# Patient Record
Sex: Female | Born: 1945
Health system: Southern US, Community
[De-identification: ages and names within clinical notes are randomized; demographics above are authoritative.]

## PROBLEM LIST (undated history)

## (undated) DIAGNOSIS — Z9189 Other specified personal risk factors, not elsewhere classified: Secondary | ICD-10-CM

## (undated) DIAGNOSIS — F419 Anxiety disorder, unspecified: Secondary | ICD-10-CM

## (undated) DIAGNOSIS — R7303 Prediabetes: Secondary | ICD-10-CM

## (undated) DIAGNOSIS — C801 Malignant (primary) neoplasm, unspecified: Secondary | ICD-10-CM

## (undated) DIAGNOSIS — D649 Anemia, unspecified: Secondary | ICD-10-CM

## (undated) DIAGNOSIS — E039 Hypothyroidism, unspecified: Secondary | ICD-10-CM

## (undated) DIAGNOSIS — F32A Depression, unspecified: Secondary | ICD-10-CM

## (undated) DIAGNOSIS — G2581 Restless legs syndrome: Secondary | ICD-10-CM

## (undated) DIAGNOSIS — F329 Major depressive disorder, single episode, unspecified: Secondary | ICD-10-CM

## (undated) HISTORY — DX: Hypothyroidism, unspecified: E03.9

## (undated) HISTORY — DX: Other specified personal risk factors, not elsewhere classified: Z91.89

## (undated) HISTORY — DX: Prediabetes: R73.03

## (undated) HISTORY — DX: Anemia, unspecified: D64.9

## (undated) HISTORY — DX: Major depressive disorder, single episode, unspecified: F32.9

## (undated) HISTORY — DX: Anxiety disorder, unspecified: F41.9

## (undated) HISTORY — PX: THYROID CYST EXCISION: SHX2511

## (undated) HISTORY — PX: TONSILLECTOMY AND ADENOIDECTOMY: SUR1326

## (undated) HISTORY — PX: COLONOSCOPY: SHX174

## (undated) HISTORY — DX: Restless legs syndrome: G25.81

## (undated) HISTORY — DX: Depression, unspecified: F32.A

---

## 1998-01-12 ENCOUNTER — Encounter: Admission: RE | Admit: 1998-01-12 | Discharge: 1998-01-12 | Payer: Self-pay | Admitting: Family Medicine

## 1998-02-14 ENCOUNTER — Encounter: Admission: RE | Admit: 1998-02-14 | Discharge: 1998-02-14 | Payer: Self-pay | Admitting: Sports Medicine

## 1998-03-08 ENCOUNTER — Encounter: Admission: RE | Admit: 1998-03-08 | Discharge: 1998-03-08 | Payer: Self-pay | Admitting: Family Medicine

## 1998-05-02 ENCOUNTER — Encounter: Admission: RE | Admit: 1998-05-02 | Discharge: 1998-05-02 | Payer: Self-pay | Admitting: Sports Medicine

## 1998-05-03 ENCOUNTER — Encounter: Admission: RE | Admit: 1998-05-03 | Discharge: 1998-05-03 | Payer: Self-pay | Admitting: Family Medicine

## 1998-10-11 ENCOUNTER — Encounter: Admission: RE | Admit: 1998-10-11 | Discharge: 1998-10-11 | Payer: Self-pay | Admitting: Family Medicine

## 1999-07-10 ENCOUNTER — Encounter: Admission: RE | Admit: 1999-07-10 | Discharge: 1999-07-10 | Payer: Self-pay | Admitting: Family Medicine

## 1999-07-17 ENCOUNTER — Encounter: Admission: RE | Admit: 1999-07-17 | Discharge: 1999-07-17 | Payer: Self-pay | Admitting: Family Medicine

## 1999-08-28 ENCOUNTER — Encounter: Admission: RE | Admit: 1999-08-28 | Discharge: 1999-08-28 | Payer: Self-pay | Admitting: Family Medicine

## 2000-05-06 ENCOUNTER — Encounter: Admission: RE | Admit: 2000-05-06 | Discharge: 2000-05-06 | Payer: Self-pay | Admitting: Sports Medicine

## 2000-08-12 ENCOUNTER — Other Ambulatory Visit: Admission: RE | Admit: 2000-08-12 | Discharge: 2000-08-12 | Payer: Self-pay | Admitting: Family Medicine

## 2001-10-07 ENCOUNTER — Other Ambulatory Visit: Admission: RE | Admit: 2001-10-07 | Discharge: 2001-10-07 | Payer: Self-pay | Admitting: Family Medicine

## 2002-06-01 ENCOUNTER — Encounter: Admission: RE | Admit: 2002-06-01 | Discharge: 2002-06-01 | Payer: Self-pay | Admitting: Family Medicine

## 2002-06-01 ENCOUNTER — Encounter: Payer: Self-pay | Admitting: Family Medicine

## 2002-08-30 ENCOUNTER — Encounter: Admission: RE | Admit: 2002-08-30 | Discharge: 2002-08-30 | Payer: Self-pay

## 2002-10-11 ENCOUNTER — Other Ambulatory Visit: Admission: RE | Admit: 2002-10-11 | Discharge: 2002-10-11 | Payer: Self-pay | Admitting: Family Medicine

## 2005-04-16 ENCOUNTER — Other Ambulatory Visit: Admission: RE | Admit: 2005-04-16 | Discharge: 2005-04-16 | Payer: Self-pay | Admitting: Family Medicine

## 2005-09-16 ENCOUNTER — Ambulatory Visit (HOSPITAL_COMMUNITY): Admission: RE | Admit: 2005-09-16 | Discharge: 2005-09-16 | Payer: Self-pay | Admitting: Family Medicine

## 2008-03-04 ENCOUNTER — Other Ambulatory Visit: Admission: RE | Admit: 2008-03-04 | Discharge: 2008-03-04 | Payer: Self-pay | Admitting: Family Medicine

## 2009-04-19 ENCOUNTER — Other Ambulatory Visit: Admission: RE | Admit: 2009-04-19 | Discharge: 2009-04-19 | Payer: Self-pay | Admitting: Internal Medicine

## 2010-05-02 ENCOUNTER — Other Ambulatory Visit: Admission: RE | Admit: 2010-05-02 | Discharge: 2010-05-02 | Payer: Self-pay | Admitting: Internal Medicine

## 2010-10-25 ENCOUNTER — Ambulatory Visit (HOSPITAL_BASED_OUTPATIENT_CLINIC_OR_DEPARTMENT_OTHER): Admission: RE | Admit: 2010-10-25 | Payer: 59 | Source: Ambulatory Visit | Admitting: Orthopedic Surgery

## 2011-05-20 ENCOUNTER — Ambulatory Visit: Payer: 59 | Admitting: Internal Medicine

## 2011-06-25 DIAGNOSIS — E039 Hypothyroidism, unspecified: Secondary | ICD-10-CM | POA: Diagnosis not present

## 2011-06-25 DIAGNOSIS — Z79899 Other long term (current) drug therapy: Secondary | ICD-10-CM | POA: Diagnosis not present

## 2011-06-25 DIAGNOSIS — Z1212 Encounter for screening for malignant neoplasm of rectum: Secondary | ICD-10-CM | POA: Diagnosis not present

## 2011-06-25 DIAGNOSIS — I10 Essential (primary) hypertension: Secondary | ICD-10-CM | POA: Diagnosis not present

## 2011-06-25 DIAGNOSIS — E782 Mixed hyperlipidemia: Secondary | ICD-10-CM | POA: Diagnosis not present

## 2011-06-25 DIAGNOSIS — E559 Vitamin D deficiency, unspecified: Secondary | ICD-10-CM | POA: Diagnosis not present

## 2011-07-03 DIAGNOSIS — L570 Actinic keratosis: Secondary | ICD-10-CM | POA: Diagnosis not present

## 2011-07-03 DIAGNOSIS — L259 Unspecified contact dermatitis, unspecified cause: Secondary | ICD-10-CM | POA: Diagnosis not present

## 2011-07-24 DIAGNOSIS — L259 Unspecified contact dermatitis, unspecified cause: Secondary | ICD-10-CM | POA: Diagnosis not present

## 2012-02-11 DIAGNOSIS — N3000 Acute cystitis without hematuria: Secondary | ICD-10-CM | POA: Diagnosis not present

## 2012-02-11 DIAGNOSIS — E039 Hypothyroidism, unspecified: Secondary | ICD-10-CM | POA: Diagnosis not present

## 2012-02-11 DIAGNOSIS — Z79899 Other long term (current) drug therapy: Secondary | ICD-10-CM | POA: Diagnosis not present

## 2012-02-11 DIAGNOSIS — R209 Unspecified disturbances of skin sensation: Secondary | ICD-10-CM | POA: Diagnosis not present

## 2012-02-11 DIAGNOSIS — E559 Vitamin D deficiency, unspecified: Secondary | ICD-10-CM | POA: Diagnosis not present

## 2012-02-11 DIAGNOSIS — D649 Anemia, unspecified: Secondary | ICD-10-CM | POA: Diagnosis not present

## 2012-02-11 DIAGNOSIS — R7309 Other abnormal glucose: Secondary | ICD-10-CM | POA: Diagnosis not present

## 2012-02-11 DIAGNOSIS — D518 Other vitamin B12 deficiency anemias: Secondary | ICD-10-CM | POA: Diagnosis not present

## 2012-02-13 DIAGNOSIS — H9319 Tinnitus, unspecified ear: Secondary | ICD-10-CM | POA: Diagnosis not present

## 2012-02-13 DIAGNOSIS — H905 Unspecified sensorineural hearing loss: Secondary | ICD-10-CM | POA: Diagnosis not present

## 2012-02-13 DIAGNOSIS — H6120 Impacted cerumen, unspecified ear: Secondary | ICD-10-CM | POA: Diagnosis not present

## 2012-03-31 DIAGNOSIS — N3000 Acute cystitis without hematuria: Secondary | ICD-10-CM | POA: Diagnosis not present

## 2012-03-31 DIAGNOSIS — E559 Vitamin D deficiency, unspecified: Secondary | ICD-10-CM | POA: Diagnosis not present

## 2012-03-31 DIAGNOSIS — E039 Hypothyroidism, unspecified: Secondary | ICD-10-CM | POA: Diagnosis not present

## 2012-06-29 DIAGNOSIS — Z79899 Other long term (current) drug therapy: Secondary | ICD-10-CM | POA: Diagnosis not present

## 2012-06-29 DIAGNOSIS — I10 Essential (primary) hypertension: Secondary | ICD-10-CM | POA: Diagnosis not present

## 2012-06-29 DIAGNOSIS — E782 Mixed hyperlipidemia: Secondary | ICD-10-CM | POA: Diagnosis not present

## 2012-06-29 DIAGNOSIS — R7309 Other abnormal glucose: Secondary | ICD-10-CM | POA: Diagnosis not present

## 2012-06-29 DIAGNOSIS — E559 Vitamin D deficiency, unspecified: Secondary | ICD-10-CM | POA: Diagnosis not present

## 2012-07-03 DIAGNOSIS — L570 Actinic keratosis: Secondary | ICD-10-CM | POA: Diagnosis not present

## 2012-07-21 ENCOUNTER — Other Ambulatory Visit (HOSPITAL_COMMUNITY): Payer: Self-pay | Admitting: Internal Medicine

## 2012-07-21 DIAGNOSIS — Z780 Asymptomatic menopausal state: Secondary | ICD-10-CM

## 2012-07-21 DIAGNOSIS — Z1231 Encounter for screening mammogram for malignant neoplasm of breast: Secondary | ICD-10-CM

## 2012-07-31 ENCOUNTER — Ambulatory Visit (HOSPITAL_COMMUNITY): Payer: 59

## 2012-07-31 ENCOUNTER — Other Ambulatory Visit (HOSPITAL_COMMUNITY): Payer: 59

## 2012-08-04 ENCOUNTER — Ambulatory Visit (HOSPITAL_COMMUNITY): Payer: 59

## 2012-09-03 DIAGNOSIS — E209 Hypoparathyroidism, unspecified: Secondary | ICD-10-CM | POA: Diagnosis not present

## 2012-09-03 DIAGNOSIS — E559 Vitamin D deficiency, unspecified: Secondary | ICD-10-CM | POA: Diagnosis not present

## 2012-09-03 DIAGNOSIS — I10 Essential (primary) hypertension: Secondary | ICD-10-CM | POA: Diagnosis not present

## 2012-09-03 DIAGNOSIS — D649 Anemia, unspecified: Secondary | ICD-10-CM | POA: Diagnosis not present

## 2012-09-10 DIAGNOSIS — F411 Generalized anxiety disorder: Secondary | ICD-10-CM | POA: Diagnosis not present

## 2012-09-11 ENCOUNTER — Encounter: Payer: Self-pay | Admitting: Internal Medicine

## 2012-09-24 DIAGNOSIS — F331 Major depressive disorder, recurrent, moderate: Secondary | ICD-10-CM | POA: Diagnosis not present

## 2012-09-28 ENCOUNTER — Ambulatory Visit (INDEPENDENT_AMBULATORY_CARE_PROVIDER_SITE_OTHER): Payer: Medicare Other | Admitting: Internal Medicine

## 2012-09-28 ENCOUNTER — Other Ambulatory Visit: Payer: Medicare Other

## 2012-09-28 ENCOUNTER — Encounter: Payer: Self-pay | Admitting: Internal Medicine

## 2012-09-28 VITALS — BP 110/70 | HR 56 | Ht 69.0 in | Wt 119.0 lb

## 2012-09-28 DIAGNOSIS — R1310 Dysphagia, unspecified: Secondary | ICD-10-CM | POA: Diagnosis not present

## 2012-09-28 DIAGNOSIS — D509 Iron deficiency anemia, unspecified: Secondary | ICD-10-CM

## 2012-09-28 DIAGNOSIS — Z1211 Encounter for screening for malignant neoplasm of colon: Secondary | ICD-10-CM | POA: Diagnosis not present

## 2012-09-28 MED ORDER — MOVIPREP 100 G PO SOLR: 1.0000 | Freq: Once | ORAL | Status: DC

## 2012-09-28 NOTE — Patient Instructions (Addendum)
Your physician has requested that you go to the basement for the following lab work before leaving today:  TTG  You have been scheduled for an endoscopy and colonoscopy with propofol. Please follow the written instructions given to you at your visit today. Please pick up your prep at the pharmacy within the next 1-3 days. If you use inhalers (even only as needed), please bring them with you on the day of your procedure.

## 2012-09-28 NOTE — Progress Notes (Signed)
HISTORY OF PRESENT ILLNESS:  Debra Hayes is a 67 y.o. female with anxiety, depression and hypothyroidism who is sent today by her primary provider regarding possible iron deficiency and the need for colonoscopy. Review of outside laboratories from 09/03/2012 finds a normal hemoglobin 15.3. Also normal is the MCV at 89.1. Iron saturation is borderline low at 19%. However serum iron is normal at 70, reticulocytes normal at 1.3%, and ferritin normal at 121. Also normal is her sedimentation rate. The patient denies prior history GI evaluations. She has not had screening colonoscopy. Her GI review of systems is remarkable for a vague dysphagia, 8 pound weight loss over the past year, and belching. She does report a family history of celiac sprue. She denies melena or hematochezia. No NSAIDs. No recent blood donation  REVIEW OF SYSTEMS:  All non-GI ROS negative except for bitter taste in mouth, warm sensation over the body, anxiety, depression, irregular heartbeat, sleeping problems, excessive urination  Past Medical History  Diagnosis Date  . Anxiety   . Hypothyroidism   . Anemia   . Depression   . At high risk for altered glucose metabolism   . RLS (restless legs syndrome)     Past Surgical History  Procedure Laterality Date  . Thyroid cyst excision      Social History Debra Hayes  reports that she has quit smoking. She has never used smokeless tobacco. She reports that  drinks alcohol. She reports that she does not use illicit drugs.  family history includes Bladder Cancer in her mother; Heart disease in her father; and Thyroid cancer in her father.  No Known Allergies     PHYSICAL EXAMINATION: Vital signs: BP 110/70  Pulse 56  Ht 5\' 9"  (1.753 m)  Wt 119 lb (53.978 kg)  BMI 17.57 kg/m2  Constitutional: generally well-appearing, no acute distress Psychiatric: alert and oriented x3, cooperative Eyes: extraocular movements intact, anicteric, conjunctiva pink Mouth: oral  pharynx moist, no lesions Neck: supple no lymphadenopathy Cardiovascular: heart regular rate and rhythm, no murmur Lungs: clear to auscultation bilaterally Abdomen: soft, nontender, nondistended, no obvious ascites, no peritoneal signs, normal bowel sounds, no organomegaly Rectal: Deferred until colonoscopy Extremities: no lower extremity edema bilaterally Skin: no lesions on visible extremities Neuro: No focal deficits. No asterixis.    ASSESSMENT:  #1. Question early iron deficiency. #2. Vague dysphagia #3. Screening colonoscopy. Baseline risk. Appropriate candidate without contraindication   PLAN:  #1. Colonoscopy.The nature of the procedure, as well as the risks, benefits, and alternatives were carefully and thoroughly reviewed with the patient. Ample time for discussion and questions allowed. The patient understood, was satisfied, and agreed to proceed. #2. Movi prep prescribed. The patient instructed on its use #3. Upper endoscopy with duodenal biopsies.The nature of the procedure, as well as the risks, benefits, and alternatives were carefully and thoroughly reviewed with the patient. Ample time for discussion and questions allowed. The patient understood, was satisfied, and agreed to proceed. #4. Tissue transglutaminase antibody to screen for celiac disease

## 2012-09-29 LAB — TISSUE TRANSGLUTAMINASE, IGA: Tissue Transglutaminase Ab, IgA: 2.3 U/mL (ref <20)

## 2012-10-05 ENCOUNTER — Telehealth: Payer: Self-pay | Admitting: Internal Medicine

## 2012-10-05 NOTE — Telephone Encounter (Signed)
Pt states that when she had her OV she did not have her questions ready. Pt has several questions and would either like for Dr. Marina Goodell to call her or address her questions through this message.  1. Pt wants to know if there was an indicator in her labs that show a decrease in iron uptake or a lack there of and is this what shows   the need for the procedures.  2. If there is a flag in the bloodwork does it indicate that she may be bleeding from the inside or does she have malabsorption of iron.  3. Is it urgent for her to do the procedures now or can she safely wait a couple of months.  4. Would an "iron shot" help her situation.  5. Why does Dr. Marina Goodell propose using propofol instead of twilight sleep.  6. If she is having both procedures in one day does that propose an increased risk being under anesthesia the longer amount of time.  7. As she has aged she has noticed that it takes longer for her to heal and she bleeds more. Pt wants to know that if she has polyps and has them removed is that something to be concerned about....healing slower and the bleeding.  8. If a colon was done on her in the past (which it was not) and it was normal would he feel it is necessary to have the procedure done now.  9. Her recent labs have shown her glucose elevated and she is concerned about the prep and drinking juices and is concerned about her blood sugar changes and if that is something to be concerned about.

## 2012-10-07 NOTE — Telephone Encounter (Signed)
I had a long telephone conversation with the patient this morning. I answered each for 9 questions below in detail. She was satisfied with the answers and appreciative for my call. She will proceed with her endoscopic evaluations tomorrow as planned

## 2012-10-08 ENCOUNTER — Encounter: Payer: Self-pay | Admitting: Internal Medicine

## 2012-10-08 ENCOUNTER — Ambulatory Visit (AMBULATORY_SURGERY_CENTER): Payer: Medicare Other | Admitting: Internal Medicine

## 2012-10-08 VITALS — BP 105/70 | HR 60 | Temp 97.6°F | Resp 25 | Ht 69.0 in | Wt 119.0 lb

## 2012-10-08 DIAGNOSIS — K5730 Diverticulosis of large intestine without perforation or abscess without bleeding: Secondary | ICD-10-CM

## 2012-10-08 DIAGNOSIS — D1330 Benign neoplasm of unspecified part of small intestine: Secondary | ICD-10-CM

## 2012-10-08 DIAGNOSIS — D509 Iron deficiency anemia, unspecified: Secondary | ICD-10-CM | POA: Diagnosis not present

## 2012-10-08 DIAGNOSIS — Z1211 Encounter for screening for malignant neoplasm of colon: Secondary | ICD-10-CM | POA: Diagnosis not present

## 2012-10-08 MED ORDER — SODIUM CHLORIDE 0.9 % IV SOLN: 500.0000 mL | INTRAVENOUS | Status: DC

## 2012-10-08 NOTE — Op Note (Signed)
[facility] [address] [place], [address]   ENDOSCOPY PROCEDURE REPORT  PATIENT: Debra Hayes, Debra Hayes  MR#: [id number] BIRTHDATE: 25-Jun-1945 , 66  yrs. old GENDER: Female ENDOSCOPIST: Roxy Cedar, MD REFERRED BY:  Lucky Cowboy, M.D. PROCEDURE DATE:  10/08/2012 PROCEDURE:  EGD w/ biopsy ASA CLASS:     Class II INDICATIONS:  Iron deficiency anemia. MEDICATIONS: MAC sedation, administered by CRNA and propofol (Diprivan) 100mg  IV TOPICAL ANESTHETIC: none  DESCRIPTION OF PROCEDURE: After the risks benefits and alternatives of the procedure were thoroughly explained, informed consent was obtained.  The LB GIF-H180 [id number] endoscope was introduced through the mouth and advanced to the second portion of the duodenum. Without limitations.  The instrument was slowly withdrawn as the mucosa was fully examined.      The upper, middle and distal third of the esophagus were carefully inspected and no abnormalities were noted.  The z-line was well seen at the GEJ.  The endoscope was pushed into the fundus which was normal including a retroflexed view.  The antrum, gastric body, first and second part of the duodenum were unremarkable.Duodenal bx taken to r/o sprue.  Retroflexed views revealed no abnormalities. The scope was then withdrawn from the patient and the procedure completed.  COMPLICATIONS: There were no complications. ENDOSCOPIC IMPRESSION: 1. Normal EGD  RECOMMENDATIONS: 1. Await biopsy results 2. Multivitamin with iron  REPEAT EXAM:  eSigned:  Roxy Cedar, MD 10/08/2012 12:36 PM   XB:[id number] Oneta Rack, MD and The Patient

## 2012-10-08 NOTE — Progress Notes (Signed)
Called to room to assist during endoscopic procedure.  Patient ID and intended procedure confirmed with present staff. Received instructions for my participation in the procedure from the performing physician.  

## 2012-10-08 NOTE — Progress Notes (Signed)
Lidocaine-40mg IV prior to Propofol InductionPropofol given over incremental dosages 

## 2012-10-08 NOTE — Patient Instructions (Addendum)
Discharge instructions given with verbal understanding. Handouts on diverticulosis and a high fiber diet given. Resume previous medications. YOU HAD AN ENDOSCOPIC PROCEDURE TODAY AT THE [facility]: Refer to the procedure report that was given to you for any specific questions about what was found during the examination.  If the procedure report does not answer your questions, please call your gastroenterologist to clarify.  If you requested that your care partner not be given the details of your procedure findings, then the procedure report has been included in a sealed envelope for you to review at your convenience later.  YOU SHOULD EXPECT: Some feelings of bloating in the abdomen. Passage of more gas than usual.  Walking can help get rid of the air that was put into your GI tract during the procedure and reduce the bloating. If you had a lower endoscopy (such as a colonoscopy or flexible sigmoidoscopy) you may notice spotting of blood in your stool or on the toilet paper. If you underwent a bowel prep for your procedure, then you may not have a normal bowel movement for a few days.  DIET: Your first meal following the procedure should be a light meal and then it is ok to progress to your normal diet.  A half-sandwich or bowl of soup is an example of a good first meal.  Heavy or fried foods are harder to digest and may make you feel nauseous or bloated.  Likewise meals heavy in dairy and vegetables can cause extra gas to form and this can also increase the bloating.  Drink plenty of fluids but you should avoid alcoholic beverages for 24 hours.  ACTIVITY: Your care partner should take you home directly after the procedure.  You should plan to take it easy, moving slowly for the rest of the day.  You can resume normal activity the day after the procedure however you should NOT DRIVE or use heavy machinery for 24 hours (because of the sedation medicines used during the test).    SYMPTOMS TO  REPORT IMMEDIATELY: A gastroenterologist can be reached at any hour.  During normal business hours, 8:30 AM to 5:00 PM Monday through Friday, call [phone].  After hours and on weekends, please call the GI answering service at [phone] who will take a message and have the physician on call contact you.   Following lower endoscopy (colonoscopy or flexible sigmoidoscopy):  Excessive amounts of blood in the stool  Significant tenderness or worsening of abdominal pains  Swelling of the abdomen that is new, acute  Fever of 100F or higher  Following upper endoscopy (EGD)  Vomiting of blood or coffee ground material  New chest pain or pain under the shoulder blades  Painful or persistently difficult swallowing  New shortness of breath  Fever of 100F or higher  Black, tarry-looking stools  FOLLOW UP: If any biopsies were taken you will be contacted by phone or by letter within the next 1-3 weeks.  Call your gastroenterologist if you have not heard about the biopsies in 3 weeks.  Our staff will call the home number listed on your records the next business day following your procedure to check on you and address any questions or concerns that you may have at that time regarding the information given to you following your procedure. This is a courtesy call and so if there is no answer at the home number and we have not heard from you through the emergency physician on call, we will  assume that you have returned to your regular daily activities without incident.  SIGNATURES/CONFIDENTIALITY: You and/or your care partner have signed paperwork which will be entered into your electronic medical record.  These signatures attest to the fact that that the information above on your After Visit Summary has been reviewed and is understood.  Full responsibility of the confidentiality of this discharge information lies with you and/or your care-partner.

## 2012-10-08 NOTE — Op Note (Signed)
[facility] [address] [place], [address]   COLONOSCOPY PROCEDURE REPORT  PATIENT: [name], [name]  MR#: [id number] BIRTHDATE: October 01, 1945 , 66  yrs. old GENDER: Female ENDOSCOPIST: Roxy Cedar, MD REFERRED IO:[id number] Oneta Rack, M.D. PROCEDURE DATE:  10/08/2012 PROCEDURE:   Colonoscopy, screening ASA CLASS:   Class II INDICATIONS:average risk screening. MEDICATIONS: MAC sedation, administered by CRNA and propofol (Diprivan) 200mg  IV  DESCRIPTION OF PROCEDURE:   After the risks benefits and alternatives of the procedure were thoroughly explained, informed consent was obtained.  A digital rectal exam revealed no abnormalities of the rectum.   The LB CF-H180AL [id number]  endoscope was introduced through the anus and advanced to the cecum, which was identified by both the appendix and ileocecal valve. No adverse events experienced.   The quality of the prep was excellent, using MoviPrep  The instrument was then slowly withdrawn as the colon was fully examined.      COLON FINDINGS: Moderate diverticulosis was noted in the sigmoid colon.   The colon mucosa was otherwise normal.   The mucosa appeared normal in the terminal ileum.  Retroflexed views revealed internal hemorrhoids. The time to cecum=5 minutes 32 seconds. Withdrawal time=11 minutes 30 seconds.  The scope was withdrawn and the procedure completed. COMPLICATIONS: There were no complications.  ENDOSCOPIC IMPRESSION: 1.   Moderate diverticulosis was noted in the sigmoid colon 2.   The colon mucosa was otherwise normal 3.   Normal mucosa in the terminal ileum  RECOMMENDATIONS: 1.  Continue current colorectal screening recommendations for "routine risk" patients with a repeat colonoscopy in 10 years. 2.  Upper endoscopy today (see report)   eSigned:  Roxy Cedar, MD 10/08/2012 12:32 PM   cc: Lucky Cowboy, MD and The Patient   PATIENT NAME:  Debra Hayes, Debra Hayes MR#: [id number]

## 2012-10-08 NOTE — Progress Notes (Signed)
Patient did not experience any of the following events: a burn prior to discharge; a fall within the facility; wrong site/side/patient/procedure/implant event; or a hospital transfer or hospital admission upon discharge from the facility. (G8907) Patient did not have preoperative order for IV antibiotic SSI prophylaxis. (G8918)  

## 2012-10-09 ENCOUNTER — Telehealth: Payer: Self-pay

## 2012-10-09 NOTE — Telephone Encounter (Signed)
  Follow up Call-  Call back number 10/08/2012  Post procedure Call Back phone  # [phone]  Permission to leave phone message Yes     Patient questions:  Do you have a fever, pain , or abdominal swelling? no Pain Score  0 *  Have you tolerated food without any problems? yes  Have you been able to return to your normal activities? yes  Do you have any questions about your discharge instructions: Diet   no Medications  no Follow up visit  no  Do you have questions or concerns about your Care? no  Actions: * If pain score is 4 or above: No action needed, pain <4.

## 2012-10-14 ENCOUNTER — Encounter: Payer: Self-pay | Admitting: Internal Medicine

## 2012-10-20 DIAGNOSIS — Z419 Encounter for procedure for purposes other than remedying health state, unspecified: Secondary | ICD-10-CM | POA: Diagnosis not present

## 2012-10-20 DIAGNOSIS — L280 Lichen simplex chronicus: Secondary | ICD-10-CM | POA: Diagnosis not present

## 2012-11-03 ENCOUNTER — Encounter: Payer: 59 | Admitting: Internal Medicine

## 2012-11-20 ENCOUNTER — Encounter: Payer: Self-pay | Admitting: Cardiology

## 2012-11-20 ENCOUNTER — Ambulatory Visit (INDEPENDENT_AMBULATORY_CARE_PROVIDER_SITE_OTHER): Payer: Medicare Other | Admitting: Cardiology

## 2012-11-20 VITALS — BP 118/66 | HR 69 | Ht 69.0 in | Wt 129.0 lb

## 2012-11-20 DIAGNOSIS — R002 Palpitations: Secondary | ICD-10-CM | POA: Diagnosis not present

## 2012-11-20 NOTE — Progress Notes (Signed)
HPI The patient has no prior cardiac history. She does have a family history with her father having coronary disease in his 3s. She has not had any prior cardiac testing. She's had some occasional palpitations. However, she was anxious because of the family history. She does try to exercise routinely. Sometimes at the end of an exercise regimen she'll notice increased lightheadedness. She might have a sensation like her heart is working harder or beating harder at times.  The patient denies any new symptoms such as chest discomfort, neck or arm discomfort. There has been no new shortness of breath, PND or orthopnea. There have been no reported palpitations, presyncope or syncope.   No Known Allergies  Current Outpatient Prescriptions  Medication Sig Dispense Refill  . levothyroxine (SYNTHROID, LEVOTHROID) 50 MCG tablet Take 50 mcg by mouth daily before breakfast. Takes everyday except Sunday      . MOVIPREP 100 G SOLR Take 1 kit (100 g total) by mouth once.  1 kit  0  . ALPRAZolam (XANAX PO) Take by mouth as needed.      . sertraline (ZOLOFT) 50 MG tablet Take 50 mg by mouth daily.       No current facility-administered medications for this visit.    Past Medical History  Diagnosis Date  . Anxiety   . Hypothyroidism   . Anemia   . Depression   . At high risk for altered glucose metabolism   . RLS (restless legs syndrome)     Past Surgical History  Procedure Laterality Date  . Thyroid cyst excision    . Tonsillectomy and adenoidectomy      Family History  Problem Relation Age of Onset  . Heart disease Father 50    2 heart attacks  . Bladder Cancer Mother   . Thyroid cancer Father     History   Social History  . Marital Status: Single    Spouse Name: N/A    Number of Children: 0  . Years of Education: N/A   Occupational History  . Not on file.   Social History Main Topics  . Smoking status: Former Smoker    Types: Cigarettes  . Smokeless tobacco: Never Used    Comment: Smoked 40 years ago.  . Alcohol Use: Yes     Comment: very little  . Drug Use: No  . Sexually Active: Not on file   Other Topics Concern  . Not on file   Social History Narrative   Lives alone.  She has a cat.      ROS:  Or tinnitus, cough, arthritis in her fingers and numbness and tingling in her feet. Otherwise as stated in the HPI and negative for all other systems.  PHYSICAL EXAM BP 118/66  Pulse 69  Ht 5\' 9"  (1.753 m)  Wt 129 lb (58.514 kg)  BMI 19.04 kg/m2 GENERAL:  Well appearing HEENT:  Pupils equal round and reactive, fundi not visualized, oral mucosa unremarkable NECK:  No jugular venous distention, waveform within normal limits, carotid upstroke brisk and symmetric, no bruits, no thyromegaly LYMPHATICS:  No cervical, inguinal adenopathy LUNGS:  Clear to auscultation bilaterally BACK:  No CVA tenderness CHEST:  Unremarkable HEART:  PMI not displaced or sustained,S1 and S2 within normal limits, no S3, no S4, no clicks, no rubs, no murmurs ABD:  Flat, positive bowel sounds normal in frequency in pitch, no bruits, no rebound, no guarding, no midline pulsatile mass, no hepatomegaly, no splenomegaly EXT:  2 plus pulses throughout,  no edema, no cyanosis no clubbing SKIN:  No rashes no nodules NEURO:  Cranial nerves II through XII grossly intact, motor grossly intact throughout PSYCH:  Cognitively intact, oriented to person place and time  EKG:  Sinus rhythm, rate 69, axis within normal limits, intervals within normal limits, no acute ST-T wave changes.   ASSESSMENT AND PLAN  FAMILY HISTORY OF CAD:  I have a low suspicion for obstructive coronary artery disease. However, she does have risk factors with a positive family history. I will bring the patient back for a POET (Plain Old Exercise Test). This will allow me to screen for obstructive coronary disease, risk stratify and very importantly provide a prescription for exercise.  PALPITATIONS:  These are not  particularly symptomatic. At this point no change in therapy is indicated.

## 2012-11-20 NOTE — Patient Instructions (Addendum)
The current medical regimen is effective;  continue present plan and medications.  Your physician has requested that you have an exercise tolerance test. For further information please visit [email or web address]. Please also follow instruction sheet, as given.   

## 2012-12-01 ENCOUNTER — Other Ambulatory Visit: Payer: Self-pay | Admitting: Endocrinology

## 2012-12-01 DIAGNOSIS — C73 Malignant neoplasm of thyroid gland: Secondary | ICD-10-CM | POA: Diagnosis not present

## 2012-12-01 DIAGNOSIS — R7301 Impaired fasting glucose: Secondary | ICD-10-CM | POA: Diagnosis not present

## 2012-12-01 DIAGNOSIS — E890 Postprocedural hypothyroidism: Secondary | ICD-10-CM | POA: Diagnosis not present

## 2012-12-01 DIAGNOSIS — C323 Malignant neoplasm of laryngeal cartilage: Secondary | ICD-10-CM

## 2012-12-02 DIAGNOSIS — L280 Lichen simplex chronicus: Secondary | ICD-10-CM | POA: Diagnosis not present

## 2012-12-07 ENCOUNTER — Ambulatory Visit
Admission: RE | Admit: 2012-12-07 | Discharge: 2012-12-07 | Disposition: A | Discharge status: Home / Self Care | Payer: Medicare Other | Source: Ambulatory Visit | Attending: Endocrinology | Admitting: Endocrinology

## 2012-12-07 DIAGNOSIS — C73 Malignant neoplasm of thyroid gland: Secondary | ICD-10-CM | POA: Diagnosis not present

## 2012-12-07 DIAGNOSIS — C323 Malignant neoplasm of laryngeal cartilage: Secondary | ICD-10-CM

## 2012-12-08 ENCOUNTER — Other Ambulatory Visit: Payer: Self-pay | Admitting: Endocrinology

## 2012-12-08 DIAGNOSIS — E041 Nontoxic single thyroid nodule: Secondary | ICD-10-CM

## 2012-12-10 ENCOUNTER — Ambulatory Visit (INDEPENDENT_AMBULATORY_CARE_PROVIDER_SITE_OTHER): Payer: Medicare Other | Admitting: Physician Assistant

## 2012-12-10 DIAGNOSIS — R0789 Other chest pain: Secondary | ICD-10-CM

## 2012-12-10 DIAGNOSIS — R002 Palpitations: Secondary | ICD-10-CM

## 2012-12-10 DIAGNOSIS — Z8249 Family history of ischemic heart disease and other diseases of the circulatory system: Secondary | ICD-10-CM | POA: Diagnosis not present

## 2012-12-10 NOTE — Progress Notes (Signed)
Exercise Treadmill Test  Pre-Exercise Testing Evaluation Rhythm: normal sinus  Rate: 72     Test  Exercise Tolerance Test Ordering MD: Angelina Sheriff, MD  Interpreting MD: Tereso Newcomer, PA-C  Unique Test No: 1  Treadmill:  1  Indication for ETT: chest pain - rule out ischemia  Contraindication to ETT: No   Stress Modality: exercise - treadmill  Cardiac Imaging Performed: non   Protocol: standard Bruce - maximal  Max BP:  179/75  Max MPHR (bpm):  154 85% MPR (bpm):  131  MPHR obtained (bpm):  142 % MPHR obtained:  92  Reached 85% MPHR (min:sec):  5:45 Total Exercise Time (min-sec):  7:01  Workload in METS:  8.5 Borg Scale: 19  Reason ETT Terminated:  patient's desire to stop    ST Segment Analysis At Rest: non-specific ST segment slurring With Exercise: borderline ST changes  Other Information Arrhythmia:  No Angina during ETT:  absent (0) Quality of ETT:  indeterminate  ETT Interpretation:  borderline (indeterminate) with non-specific ST changes  Comments: Fair exercise tolerance. No chest pain. Normal BP response to exercise. There is borderline inferolat ST depression that persists into recovery.  Cannot rule out ischemia. Good HR recovery in 1st minute post exercise (HR dropped by > 12 at 2 mph on 2% grade).  Recommendations: Patient with significant FHx CAD. There are borderline ST changes.  Ischemia cannot be ruled out. Discussed possibility of proceeding with further testing (myoview or ETT-Echo) She would like to think about this. Will leave ECGs for Dr. Rollene Rotunda to review. Signed,  Tereso Newcomer, PA-C   12/10/2012 11:46 AM

## 2012-12-15 ENCOUNTER — Other Ambulatory Visit (HOSPITAL_COMMUNITY)
Admission: RE | Admit: 2012-12-15 | Discharge: 2012-12-15 | Disposition: A | Discharge status: Home / Self Care | Payer: Medicare Other | Source: Ambulatory Visit | Attending: Interventional Radiology | Admitting: Interventional Radiology

## 2012-12-15 ENCOUNTER — Ambulatory Visit
Admission: RE | Admit: 2012-12-15 | Discharge: 2012-12-15 | Disposition: A | Discharge status: Home / Self Care | Payer: Medicare Other | Source: Ambulatory Visit | Attending: Endocrinology | Admitting: Endocrinology

## 2012-12-15 DIAGNOSIS — E041 Nontoxic single thyroid nodule: Secondary | ICD-10-CM | POA: Diagnosis not present

## 2012-12-15 DIAGNOSIS — E049 Nontoxic goiter, unspecified: Secondary | ICD-10-CM | POA: Insufficient documentation

## 2012-12-22 ENCOUNTER — Other Ambulatory Visit: Payer: Self-pay | Admitting: Endocrinology

## 2012-12-22 DIAGNOSIS — C73 Malignant neoplasm of thyroid gland: Secondary | ICD-10-CM

## 2013-01-06 ENCOUNTER — Other Ambulatory Visit: Payer: Self-pay | Admitting: Endocrinology

## 2013-01-06 ENCOUNTER — Ambulatory Visit
Admission: RE | Admit: 2013-01-06 | Discharge: 2013-01-06 | Disposition: A | Discharge status: Home / Self Care | Payer: Medicare Other | Source: Ambulatory Visit | Attending: Endocrinology | Admitting: Endocrinology

## 2013-01-06 DIAGNOSIS — E042 Nontoxic multinodular goiter: Secondary | ICD-10-CM | POA: Diagnosis not present

## 2013-01-06 DIAGNOSIS — E890 Postprocedural hypothyroidism: Secondary | ICD-10-CM | POA: Diagnosis not present

## 2013-02-03 DIAGNOSIS — B354 Tinea corporis: Secondary | ICD-10-CM | POA: Diagnosis not present

## 2013-05-24 ENCOUNTER — Ambulatory Visit
Admission: RE | Admit: 2013-05-24 | Discharge: 2013-05-24 | Disposition: A | Discharge status: Home / Self Care | Payer: Medicare Other | Source: Ambulatory Visit | Attending: Endocrinology | Admitting: Endocrinology

## 2013-05-24 DIAGNOSIS — C73 Malignant neoplasm of thyroid gland: Secondary | ICD-10-CM | POA: Diagnosis not present

## 2013-05-27 DIAGNOSIS — E890 Postprocedural hypothyroidism: Secondary | ICD-10-CM | POA: Diagnosis not present

## 2013-05-27 DIAGNOSIS — R7301 Impaired fasting glucose: Secondary | ICD-10-CM | POA: Diagnosis not present

## 2013-06-01 DIAGNOSIS — C73 Malignant neoplasm of thyroid gland: Secondary | ICD-10-CM | POA: Diagnosis not present

## 2013-06-01 DIAGNOSIS — R7301 Impaired fasting glucose: Secondary | ICD-10-CM | POA: Diagnosis not present

## 2013-06-01 DIAGNOSIS — E890 Postprocedural hypothyroidism: Secondary | ICD-10-CM | POA: Diagnosis not present

## 2013-07-06 ENCOUNTER — Encounter: Payer: Self-pay | Admitting: Physician Assistant

## 2013-08-17 ENCOUNTER — Encounter: Payer: Self-pay | Admitting: Physician Assistant

## 2013-08-19 ENCOUNTER — Encounter: Payer: Self-pay | Admitting: Physician Assistant

## 2013-11-11 ENCOUNTER — Encounter: Payer: Self-pay | Admitting: Physician Assistant

## 2013-11-16 ENCOUNTER — Encounter: Payer: Self-pay | Admitting: Physician Assistant

## 2013-11-18 ENCOUNTER — Encounter: Payer: Self-pay | Admitting: Physician Assistant

## 2013-11-24 ENCOUNTER — Other Ambulatory Visit: Payer: Self-pay | Admitting: Endocrinology

## 2013-11-24 DIAGNOSIS — C73 Malignant neoplasm of thyroid gland: Secondary | ICD-10-CM

## 2014-04-28 DIAGNOSIS — E039 Hypothyroidism, unspecified: Secondary | ICD-10-CM | POA: Diagnosis not present

## 2014-05-09 DIAGNOSIS — E039 Hypothyroidism, unspecified: Secondary | ICD-10-CM | POA: Insufficient documentation

## 2014-05-09 DIAGNOSIS — F325 Major depressive disorder, single episode, in full remission: Secondary | ICD-10-CM | POA: Insufficient documentation

## 2014-05-09 DIAGNOSIS — F419 Anxiety disorder, unspecified: Secondary | ICD-10-CM | POA: Insufficient documentation

## 2014-05-10 ENCOUNTER — Telehealth: Payer: Self-pay | Admitting: Internal Medicine

## 2014-05-10 ENCOUNTER — Ambulatory Visit: Payer: Self-pay | Admitting: Physician Assistant

## 2014-05-10 NOTE — Telephone Encounter (Signed)
Patient scheduled an appointment to address depression.  Patient will  resch after a consult with another provider.   Thank you, Katrina Judeth Horn [facility] [phone] Fax [phone]

## 2014-05-17 ENCOUNTER — Encounter: Payer: Self-pay | Admitting: Physician Assistant

## 2014-05-17 ENCOUNTER — Ambulatory Visit (INDEPENDENT_AMBULATORY_CARE_PROVIDER_SITE_OTHER): Payer: Medicare Other | Admitting: Physician Assistant

## 2014-05-17 VITALS — BP 130/80 | HR 76 | Temp 98.1°F | Resp 16 | Ht 68.0 in | Wt 124.0 lb

## 2014-05-17 DIAGNOSIS — R7309 Other abnormal glucose: Secondary | ICD-10-CM | POA: Diagnosis not present

## 2014-05-17 DIAGNOSIS — Z79899 Other long term (current) drug therapy: Secondary | ICD-10-CM | POA: Diagnosis not present

## 2014-05-17 DIAGNOSIS — F329 Major depressive disorder, single episode, unspecified: Secondary | ICD-10-CM | POA: Diagnosis not present

## 2014-05-17 DIAGNOSIS — E559 Vitamin D deficiency, unspecified: Secondary | ICD-10-CM | POA: Diagnosis not present

## 2014-05-17 DIAGNOSIS — E039 Hypothyroidism, unspecified: Secondary | ICD-10-CM | POA: Diagnosis not present

## 2014-05-17 DIAGNOSIS — F32A Depression, unspecified: Secondary | ICD-10-CM

## 2014-05-17 DIAGNOSIS — D649 Anemia, unspecified: Secondary | ICD-10-CM | POA: Diagnosis not present

## 2014-05-17 DIAGNOSIS — E538 Deficiency of other specified B group vitamins: Secondary | ICD-10-CM | POA: Diagnosis not present

## 2014-05-17 DIAGNOSIS — R7303 Prediabetes: Secondary | ICD-10-CM

## 2014-05-17 DIAGNOSIS — R002 Palpitations: Secondary | ICD-10-CM

## 2014-05-17 DIAGNOSIS — Z0001 Encounter for general adult medical examination with abnormal findings: Secondary | ICD-10-CM | POA: Diagnosis not present

## 2014-05-17 DIAGNOSIS — Z789 Other specified health status: Secondary | ICD-10-CM

## 2014-05-17 DIAGNOSIS — R6889 Other general symptoms and signs: Secondary | ICD-10-CM | POA: Diagnosis not present

## 2014-05-17 DIAGNOSIS — F419 Anxiety disorder, unspecified: Secondary | ICD-10-CM

## 2014-05-17 DIAGNOSIS — G4700 Insomnia, unspecified: Secondary | ICD-10-CM

## 2014-05-17 MED ORDER — SERTRALINE HCL 50 MG PO TABS: 50.0000 mg | ORAL_TABLET | Freq: Every day | ORAL | Status: DC

## 2014-05-17 NOTE — Progress Notes (Signed)
MEDICARE ANNUAL WELLNESS VISIT AND FOLLOW UP  Assessment:   1. Hypothyroidism, unspecified hypothyroidism type Continue follow up Dr. Chalmers Cater  2. Palpitation ? Anxiety versus GERD- will do one pill daily and does not want to treat the GERD at this time, will do diet  3. Anemia, unspecified anemia type Check labs, normal colonoscopy  4. Anxiety Zoloft added, continue counseling, if she does not tolerate discussed wellbutrin and lexapro as possible options  5. Depression Depression/Anxiety- continue medications, stress management techniques discussed, increase water, good sleep hygiene discussed, increase exercise, and increase veggies.   6. Insomnia-  -? Anxiety, ? RLS- long discussion will try zoloft first., discussed amitriptyline as a possibility, wants to avoid benzos.   Schedule MGM 1 month follow up   Plan:   During the course of the visit the patient was educated and counseled about appropriate screening and preventive services including:    Pneumococcal vaccine   Influenza vaccine  Td vaccine  Screening electrocardiogram  Screening mammography  Bone densitometry screening  Colorectal cancer screening  Diabetes screening  Glaucoma screening  Nutrition counseling   Advanced directives: given info/requested  Screening recommendations, referrals:  Vaccinations: Please see documentation below and orders this visit.   Nutrition assessed and recommended  Colonoscopy up to date Mammogram requested Pap smear not indicated Pelvic exam not indicated Recommended yearly ophthalmology/optometry visit for glaucoma screening and checkup Recommended yearly dental visit for hygiene and checkup Advanced directives - requested  Conditions/risks identified: BMI: Discussed weight loss, diet, and increase physical activity.  Increase physical activity: AHA recommends 150 minutes of physical activity a week.  Medications reviewed DEXA- requested Diabetes is at  goal, ACE/ARB therapy: No, Reason not on Ace Inhibitor/ARB therapy:  only predm Urinary Incontinence is not an issue: discussed non pharmacology and pharmacology options.  Fall risk: low- discussed PT, home fall assessment, medications.    Subjective:   Debra Hayes is a 68 y.o. female who presents for Medicare Annual Wellness Visit and 3 month follow up on hypertension, prediabetes, hyperlipidemia, vitamin D def.  Date of last medicare wellness visit is unknown.   Her blood pressure has been controlled at home, today their BP is BP: 130/80 mmHg She does workout. She denies chest pain, shortness of breath, dizziness.  She is not on cholesterol medication and denies myalgias. Her cholesterol is at goal. The cholesterol last visit was: LDL 93  She has been working on diet and exercise for prediabetes, and denies paresthesia of the feet, polydipsia and polyuria. Last A1C in the office was: 6.0 in 06/2012 Patient is on Vitamin D supplement, was 34 last visit. She is on thyroid medication, she has been seeing Dr. Chalmers Cater for her TSH. Her medication was not changed last visit was 3.946.  She was put on zoloft for anxiety/depression but is not longer on that, she states that this winter that her depression is worse. She sees a therapist, Dr. Kevan Ny and she suggests getting on a medication. Brother passed recently this summer from [place]. She states that she has has decrease interest in doing things, decrease eating, decreased sleeping. She also states she feels very restless, feels better walking, doing things.  Denies SI/HI.  She will have chest tightness/palpitations with anxiety, but denies any with exertion.  Has occ GERD symptoms.   Names of Other Physician/Practitioners you currently use: 1. [facility]- here for primary care 2. Mahalia Longest, Ph-D, # [phone], email [email or web address] 3. Dr. Trenton Gammon, dentist 4. Dr. Chalmers Cater  5. Dr. Sabra Heck, has appt  Dec Patient Care Team: Unk Pinto, MD as PCP - General (Internal Medicine) Rozetta Nunnery, MD as Consulting Physician (Otolaryngology) Melissa Noon, OD as Referring Physician (Optometry) Rolm Bookbinder, MD as Consulting Physician (Dermatology) Irene Shipper, MD as Consulting Physician (Gastroenterology)  Medication Review Current Outpatient Prescriptions on File Prior to Visit  Medication Sig Dispense Refill  . levothyroxine (SYNTHROID, LEVOTHROID) 50 MCG tablet Take 50 mcg by mouth daily before breakfast. Takes everyday except Sunday     No current facility-administered medications on file prior to visit.    Current Problems (verified) Patient Active Problem List   Diagnosis Date Noted  . Anemia   . Anxiety   . Depression   . Hypothyroidism   . Palpitation 11/20/2012    Screening Tests Health Maintenance  Topic Date Due  . ZOSTAVAX  05/14/2006  . MAMMOGRAM  09/17/2007  . PNEUMOCOCCAL POLYSACCHARIDE VACCINE AGE 68 AND OVER  05/15/2011  . INFLUENZA VACCINE  01/15/2014  . TETANUS/TDAP  06/17/2016  . COLONOSCOPY  10/09/2022     Immunization History  Administered Date(s) Administered  . Tdap 06/17/2006   Preventative care: Last colonoscopy: 2014 EGD 2014 normal Last mammogram: 2007- needs Last pap smear/pelvic exam: 2011 normal LMP age 25  DEXA:2008 MRI head normal 2004 Thyroid US 2014 unchanged  Prior vaccinations: TD or Tdap: 2008  Influenza: 2008 Pneumococcal: declines Prevnar13:  declines Shingles/Zostavax: declines  History reviewed: allergies, current medications, past family history, past medical history, past social history, past surgical history and problem list    Medication List       levothyroxine 50 MCG tablet  Commonly known as:  SYNTHROID, LEVOTHROID  Take 50 mcg by mouth daily before breakfast. Takes everyday except Sunday        Past Surgical History  Procedure Laterality Date  . Thyroid cyst excision    . Tonsillectomy and  adenoidectomy     Family History  Problem Relation Age of Onset  . Heart disease Father 50    2 heart attacks  . Bladder Cancer Mother   . Thyroid cancer Father     Risk Factors: Osteoporosis/FallRisk: postmenopausal estrogen deficiency and dietary calcium and/or vitamin D deficiency In the past year have you fallen or had a near fall?:No History of fracture in the past year: no  Tobacco History  Substance Use Topics  . Smoking status: Former Smoker    Types: Cigarettes  . Smokeless tobacco: Never Used     Comment: Smoked 40 years ago.  . Alcohol Use: Yes     Comment: very little   She does not smoke.  Patient is a former smoker. Are there smokers in your home (other than you)?  No  Alcohol Current alcohol use: social drinker  Caffeine Current caffeine use: coffee 1 /day  Exercise Current exercise: walking and yoga,zumba  Nutrition/Diet Current diet: in general, a "healthy" diet    Cardiac risk factors: advanced age (older than 52 for men, 59 for women).  Depression Screen (Note: if answer to either of the following is "Yes", a more complete depression screening is indicated)   Q1: Over the past two weeks, have you felt down, depressed or hopeless? Yes  Q2: Over the past two weeks, have you felt little interest or pleasure in doing things? Yes  Have you lost interest or pleasure in daily life? No  Do you often feel hopeless? No  Do you cry easily over simple problems? Yes  Activities of Daily  Living In your present state of health, do you have any difficulty performing the following activities?:  Driving? No Managing money?  No Feeding yourself? No Getting from bed to chair? No Climbing a flight of stairs? No Preparing food and eating?: No Bathing or showering? No Getting dressed: No Getting to the toilet? No Using the toilet:No Moving around from place to place: No   Are you sexually active?  No  Do you have more than one partner?  No  Vision  Difficulties: No  Hearing Difficulties: No Do you often ask people to speak up or repeat themselves? No Do you experience ringing or noises in your ears? No Do you have difficulty understanding soft or whispered voices? No  Cognition  Do you feel that you have a problem with memory?No  Do you often misplace items? No  Do you feel safe at home?  Yes  Advanced directives Does patient have a [place]? No Does patient have a Living Will? No   Objective:   Blood pressure 130/80, pulse 76, temperature 98.1 F (36.7 C), resp. rate 16, height 5\' 8"  (1.727 m), weight 124 lb (56.246 kg). Body mass index is 18.86 kg/(m^2).  General appearance: alert, no distress, WD/WN,  female Cognitive Testing  Alert? Yes  Normal Appearance?Yes  Oriented to person? Yes  Place? Alfred Levins   Time? Yes  Recall of three objects?  Yes  Can perform simple calculations? Yes  Displays appropriate judgment?Yes  Can read the correct time from a watch face?Yes  HEENT: normocephalic, sclerae anicteric, TMs pearly, nares patent, no discharge or erythema, pharynx normal Oral cavity: MMM, no lesions Neck: supple, no lymphadenopathy, no thyromegaly, no masses Heart: RRR, normal S1, S2, no murmurs Lungs: CTA bilaterally, no wheezes, rhonchi, or rales Abdomen: +bs, soft, non tender, non distended, no masses, no hepatomegaly, no splenomegaly Musculoskeletal: nontender, no swelling, no obvious deformity Extremities: no edema, no cyanosis, no clubbing Pulses: 2+ symmetric, upper and lower extremities, normal cap refill Neurological: alert, oriented x 3, CN2-12 intact, strength normal upper extremities and lower extremities, sensation normal throughout, DTRs 2+ throughout, no cerebellar signs, gait normal Psychiatric: normal affect, behavior normal, pleasant  Breast: defer Gyn: defer Rectal: defer  Medicare Attestation I have personally reviewed: The patient's medical and social history Their use of  alcohol, tobacco or illicit drugs Their current medications and supplements The patient's functional ability including ADLs,fall risks, home safety risks, cognitive, and hearing and visual impairment Diet and physical activities Evidence for depression or mood disorders  The patient's weight, height, BMI, and visual acuity have been recorded in the chart.  I have made referrals, counseling, and provided education to the patient based on review of the above and I have provided the patient with a written personalized care plan for preventive services.     Vicie Mutters, PA-C   05/17/2014

## 2014-05-17 NOTE — Patient Instructions (Addendum)
Please start 1/2 of the zoloft for 1 week at supper time, after 1 week (5-7 days) increase to 1 pill at night.   Please call if you have any quesitons or concerns, if this does not help we will try a different medication.   Can try melatonin 5mg -15 mg at night for sleep, can also do benadryl 25-50mg  at night for sleep.  If this does not help we can try prescription medication.  Also here is some information about good sleep hygiene.   Insomnia Insomnia is frequent trouble falling and/or staying asleep. Insomnia can be a long term problem or a short term problem. Both are common. Insomnia can be a short term problem when the wakefulness is related to a certain stress or worry. Long term insomnia is often related to ongoing stress during waking hours and/or poor sleeping habits. Overtime, sleep deprivation itself can make the problem worse. Every little thing feels more severe because you are overtired and your ability to cope is decreased. CAUSES   Stress, anxiety, and depression.  Poor sleeping habits.  Distractions such as TV in the bedroom.  Naps close to bedtime.  Engaging in emotionally charged conversations before bed.  Technical reading before sleep.  Alcohol and other sedatives. They may make the problem worse. They can hurt normal sleep patterns and normal dream activity.  Stimulants such as caffeine for several hours prior to bedtime.  Pain syndromes and shortness of breath can cause insomnia.  Exercise late at night.  Changing time zones may cause sleeping problems (jet lag). It is sometimes helpful to have someone observe your sleeping patterns. They should look for periods of not breathing during the night (sleep apnea). They should also look to see how long those periods last. If you live alone or observers are uncertain, you can also be observed at a sleep clinic where your sleep patterns will be professionally monitored. Sleep apnea requires a checkup and treatment.  Give your caregivers your medical history. Give your caregivers observations your family has made about your sleep.  SYMPTOMS   Not feeling rested in the morning.  Anxiety and restlessness at bedtime.  Difficulty falling and staying asleep. TREATMENT   Your caregiver may prescribe treatment for an underlying medical disorders. Your caregiver can give advice or help if you are using alcohol or other drugs for self-medication. Treatment of underlying problems will usually eliminate insomnia problems.  Medications can be prescribed for short time use. They are generally not recommended for lengthy use.  Over-the-counter sleep medicines are not recommended for lengthy use. They can be habit forming.  You can promote easier sleeping by making lifestyle changes such as:  Using relaxation techniques that help with breathing and reduce muscle tension.  Exercising earlier in the day.  Changing your diet and the time of your last meal. No night time snacks.  Establish a regular time to go to bed.  Counseling can help with stressful problems and worry.  Soothing music and white noise may be helpful if there are background noises you cannot remove.  Stop tedious detailed work at least one hour before bedtime. HOME CARE INSTRUCTIONS   Keep a diary. Inform your caregiver about your progress. This includes any medication side effects. See your caregiver regularly. Take note of:  Times when you are asleep.  Times when you are awake during the night.  The quality of your sleep.  How you feel the next day. This information will help your caregiver care for you.  Get out of bed if you are still awake after 15 minutes. Read or do some quiet activity. Keep the lights down. Wait until you feel sleepy and go back to bed.  Keep regular sleeping and waking hours. Avoid naps.  Exercise regularly.  Avoid distractions at bedtime. Distractions include watching television or engaging in any  intense or detailed activity like attempting to balance the household checkbook.  Develop a bedtime ritual. Keep a familiar routine of bathing, brushing your teeth, climbing into bed at the same time each night, listening to soothing music. Routines increase the success of falling to sleep faster.  Use relaxation techniques. This can be using breathing and muscle tension release routines. It can also include visualizing peaceful scenes. You can also help control troubling or intruding thoughts by keeping your mind occupied with boring or repetitive thoughts like the old concept of counting sheep. You can make it more creative like imagining planting one beautiful flower after another in your backyard garden.  During your day, work to eliminate stress. When this is not possible use some of the previous suggestions to help reduce the anxiety that accompanies stressful situations. MAKE SURE YOU:   Understand these instructions.  Will watch your condition.  Will get help right away if you are not doing well or get worse. Document Released: 05/31/2000 Document Revised: 08/26/2011 Document Reviewed: 07/01/2007 [facility] Patient Information 2015 [place], [place]. This information is not intended to replace advice given to you by your health care provider. Make sure you discuss any questions you have with your health care provider.  Restless Legs Syndrome Restless legs syndrome is a movement disorder. It may also be called a sensorimotor disorder.  CAUSES  No one knows what specifically causes restless legs syndrome, but it tends to run in families. It is also more common in people with low iron, in pregnancy, in people who need dialysis, and those with nerve damage (neuropathy).Some medications may make restless legs syndrome worse.Those medications include drugs to treat high blood pressure, some heart conditions, nausea, colds, allergies, and depression. SYMPTOMS Symptoms include uncomfortable  sensations in the legs. These leg sensations are worse during periods of inactivity or rest. They are also worse while sitting or lying down. Individuals that have the disorder describe sensations in the legs that feel like:  Pulling.  Drawing.  Crawling.  Worming.  Boring.  Tingling.  Pins and needles.  Prickling.  Pain. The sensations are usually accompanied by an overwhelming urge to move the legs. Sudden muscle jerks may also occur. Movement provides temporary relief from the discomfort. In rare cases, the arms may also be affected. Symptoms may interfere with going to sleep (sleep onset insomnia). Restless legs syndrome may also be related to periodic limb movement disorder (PLMD). PLMD is another more common motor disorder. It also causes interrupted sleep. The symptoms from PLMD usually occur most often when you are awake. TREATMENT  Treatment for restless legs syndrome is symptomatic. This means that the symptoms are treated.   Massage and cold compresses may provide temporary relief.  Walk, stretch, or take a cold or hot bath.  Get regular exercise and a good night's sleep.  Avoid caffeine, alcohol, nicotine, and medications that can make it worse.  Do activities that provide mental stimulation like discussions, needlework, and video games. These may be helpful if you are not able to walk or stretch. Some medications are effective in relieving the symptoms. However, many of these medications have side effects. Ask your caregiver  about medications that may help your symptoms. Correcting iron deficiency may improve symptoms for some patients. Document Released: 05/24/2002 Document Revised: 10/18/2013 Document Reviewed: 08/30/2010 [facility] Patient Information 2015 [place], [place]. This information is not intended to replace advice given to you by your health care provider. Make sure you discuss any questions you have with your health care provider.

## 2014-05-18 LAB — CBC WITH DIFFERENTIAL/PLATELET
BASOS ABS: 0 10*3/uL (ref 0.0–0.1)
Basophils Relative: 0 % (ref 0–1)
EOS ABS: 0.1 10*3/uL (ref 0.0–0.7)
EOS PCT: 1 % (ref 0–5)
HCT: 44.6 % (ref 36.0–46.0)
Hemoglobin: 15 g/dL (ref 12.0–15.0)
LYMPHS PCT: 25 % (ref 12–46)
Lymphs Abs: 2.3 10*3/uL (ref 0.7–4.0)
MCH: 30.4 pg (ref 26.0–34.0)
MCHC: 33.6 g/dL (ref 30.0–36.0)
MCV: 90.3 fL (ref 78.0–100.0)
MPV: 10.2 fL (ref 9.4–12.4)
Monocytes Absolute: 0.6 10*3/uL (ref 0.1–1.0)
Monocytes Relative: 7 % (ref 3–12)
NEUTROS PCT: 67 % (ref 43–77)
Neutro Abs: 6.2 10*3/uL (ref 1.7–7.7)
PLATELETS: 326 10*3/uL (ref 150–400)
RBC: 4.94 MIL/uL (ref 3.87–5.11)
RDW: 13.6 % (ref 11.5–15.5)
WBC: 9.2 10*3/uL (ref 4.0–10.5)

## 2014-05-18 LAB — BASIC METABOLIC PANEL WITH GFR
BUN: 20 mg/dL (ref 6–23)
CALCIUM: 9.4 mg/dL (ref 8.4–10.5)
CO2: 25 mEq/L (ref 19–32)
CREATININE: 0.82 mg/dL (ref 0.50–1.10)
Chloride: 105 mEq/L (ref 96–112)
GFR, EST NON AFRICAN AMERICAN: 74 mL/min
GFR, Est African American: 85 mL/min
Glucose, Bld: 103 mg/dL — ABNORMAL HIGH (ref 70–99)
Potassium: 4.2 mEq/L (ref 3.5–5.3)
SODIUM: 137 meq/L (ref 135–145)

## 2014-05-18 LAB — HEPATIC FUNCTION PANEL
ALT: 17 U/L (ref 0–35)
AST: 21 U/L (ref 0–37)
Albumin: 4.5 g/dL (ref 3.5–5.2)
Alkaline Phosphatase: 66 U/L (ref 39–117)
Bilirubin, Direct: 0.1 mg/dL (ref 0.0–0.3)
Indirect Bilirubin: 0.4 mg/dL (ref 0.2–1.2)
Total Bilirubin: 0.5 mg/dL (ref 0.2–1.2)
Total Protein: 6.8 g/dL (ref 6.0–8.3)

## 2014-05-18 LAB — LIPID PANEL
Cholesterol: 201 mg/dL — ABNORMAL HIGH (ref 0–200)
HDL: 70 mg/dL
LDL Cholesterol: 101 mg/dL — ABNORMAL HIGH (ref 0–99)
Total CHOL/HDL Ratio: 2.9 ratio
Triglycerides: 148 mg/dL
VLDL: 30 mg/dL (ref 0–40)

## 2014-05-18 LAB — INSULIN, FASTING: Insulin fasting, serum: 3.8 u[IU]/mL (ref 2.0–19.6)

## 2014-05-18 LAB — IRON AND TIBC
%SAT: 21 % (ref 20–55)
IRON: 74 ug/dL (ref 42–145)
TIBC: 345 ug/dL (ref 250–470)
UIBC: 271 ug/dL (ref 125–400)

## 2014-05-18 LAB — HEMOGLOBIN A1C
Hgb A1c MFr Bld: 6.3 % — ABNORMAL HIGH
Mean Plasma Glucose: 134 mg/dL — ABNORMAL HIGH

## 2014-05-18 LAB — VITAMIN D 25 HYDROXY (VIT D DEFICIENCY, FRACTURES): Vit D, 25-Hydroxy: 38 ng/mL (ref 30–100)

## 2014-05-18 LAB — VITAMIN B12: Vitamin B-12: 839 pg/mL (ref 211–911)

## 2014-05-18 LAB — MAGNESIUM: MAGNESIUM: 2.2 mg/dL (ref 1.5–2.5)

## 2014-05-18 LAB — FERRITIN: Ferritin: 90 ng/mL (ref 10–291)

## 2014-05-23 ENCOUNTER — Ambulatory Visit
Admission: RE | Admit: 2014-05-23 | Discharge: 2014-05-23 | Disposition: A | Discharge status: Home / Self Care | Payer: Medicare Other | Source: Ambulatory Visit | Attending: Endocrinology | Admitting: Endocrinology

## 2014-05-23 DIAGNOSIS — E041 Nontoxic single thyroid nodule: Secondary | ICD-10-CM | POA: Diagnosis not present

## 2014-05-23 DIAGNOSIS — C73 Malignant neoplasm of thyroid gland: Secondary | ICD-10-CM

## 2014-05-23 DIAGNOSIS — Z85850 Personal history of malignant neoplasm of thyroid: Secondary | ICD-10-CM | POA: Diagnosis not present

## 2014-05-26 DIAGNOSIS — E039 Hypothyroidism, unspecified: Secondary | ICD-10-CM | POA: Diagnosis not present

## 2014-05-26 DIAGNOSIS — E890 Postprocedural hypothyroidism: Secondary | ICD-10-CM | POA: Diagnosis not present

## 2014-05-26 DIAGNOSIS — R7301 Impaired fasting glucose: Secondary | ICD-10-CM | POA: Diagnosis not present

## 2014-05-30 ENCOUNTER — Other Ambulatory Visit: Payer: Self-pay | Admitting: Physician Assistant

## 2014-05-30 MED ORDER — VITAMIN D (ERGOCALCIFEROL) 1.25 MG (50000 UNIT) PO CAPS: 50000.0000 [IU] | ORAL_CAPSULE | ORAL | Status: DC

## 2014-06-01 ENCOUNTER — Ambulatory Visit: Payer: BC Managed Care – PPO | Admitting: Physician Assistant

## 2014-06-01 ENCOUNTER — Encounter: Payer: Self-pay | Admitting: Physician Assistant

## 2014-06-01 VITALS — BP 102/60 | HR 88 | Temp 98.1°F | Resp 16 | Ht 68.0 in | Wt 122.0 lb

## 2014-06-01 DIAGNOSIS — E559 Vitamin D deficiency, unspecified: Secondary | ICD-10-CM | POA: Diagnosis not present

## 2014-06-01 DIAGNOSIS — G4700 Insomnia, unspecified: Secondary | ICD-10-CM

## 2014-06-01 DIAGNOSIS — E039 Hypothyroidism, unspecified: Secondary | ICD-10-CM | POA: Diagnosis not present

## 2014-06-01 DIAGNOSIS — F419 Anxiety disorder, unspecified: Secondary | ICD-10-CM

## 2014-06-01 MED ORDER — LORAZEPAM 0.5 MG PO TABS: ORAL_TABLET | ORAL | Status: AC

## 2014-06-01 MED ORDER — FLUVOXAMINE MALEATE 50 MG PO TABS: 50.0000 mg | ORAL_TABLET | Freq: Every day | ORAL | Status: DC

## 2014-06-01 NOTE — Patient Instructions (Addendum)
Recommend that you see [facility].  Address: [address] [place], [place] [address] Phone-[phone]  Please stop the zoloft and try the Fluvox 1 pill daily, this medication will take 3-4 weeks to take affect so in the mean time i have given you a medication called lorazepam to take, you can take 1/2-1 pill as needed for anxiety, please do not drive after taking this medication.   Generalized Anxiety Disorder Generalized anxiety disorder (GAD) is a mental disorder. It interferes with life functions, including relationships, work, and school. GAD is different from normal anxiety, which everyone experiences at some point in their lives in response to specific life events and activities. Normal anxiety actually helps [place] prepare for and get through these life events and activities. Normal anxiety goes away after the event or activity is over.  GAD causes anxiety that is not necessarily related to specific events or activities. It also causes excess anxiety in proportion to specific events or activities. The anxiety associated with GAD is also difficult to control. GAD can vary from mild to severe. People with severe GAD can have intense waves of anxiety with physical symptoms (panic attacks).  SYMPTOMS The anxiety and worry associated with GAD are difficult to control. This anxiety and worry are related to many life events and activities and also occur more days than not for 6 months or longer. People with GAD also have three or more of the following symptoms (one or more in children):  Restlessness.   Fatigue.  Difficulty concentrating.   Irritability.  Muscle tension.  Difficulty sleeping or unsatisfying sleep. DIAGNOSIS GAD is diagnosed through an assessment by your health care provider. Your health care provider will ask you questions aboutyour mood,physical symptoms, and events in your life. Your health care provider may ask you about your medical history and use  of alcohol or drugs, including prescription medicines. Your health care provider may also do a physical exam and blood tests. Certain medical conditions and the use of certain substances can cause symptoms similar to those associated with GAD. Your health care provider may refer you to a mental health specialist for further evaluation. TREATMENT The following therapies are usually used to treat GAD:   Medication. Antidepressant medication usually is prescribed for long-term daily control. Antianxiety medicines may be added in severe cases, especially when panic attacks occur.   Talk therapy (psychotherapy). Certain types of talk therapy can be helpful in treating GAD by providing support, education, and guidance. A form of talk therapy called cognitive behavioral therapy can teach you healthy ways to think about and react to daily life events and activities.  Stress managementtechniques. These include yoga, meditation, and exercise and can be very helpful when they are practiced regularly. A mental health specialist can help determine which treatment is best for you. Some people see improvement with one therapy. However, other people require a combination of therapies. Document Released: 09/28/2012 Document Revised: 10/18/2013 Document Reviewed: 09/28/2012 [facility] Patient Information 2015 [place], [place]. This information is not intended to replace advice given to you by your health care provider. Make sure you discuss any questions you have with your health care provider.

## 2014-06-01 NOTE — Progress Notes (Signed)
Assessment and Plan: Vitamin D def- will send in prescription of Vitamin D 50,000 IU to take once weekly, check vitamin D at next visit.  Severe anxiety-? Depression/OCD- will stop zoloft, try fluvox, explained that the medicaiton will take 3-4 weeks to work, will give TEMPORARY long acting benzo for the month. Also encouraged patient to go to psychatrist, very anxious, no SI/HI.   Follow up 1 month.  Debra Mutters, PA-C 12:16 PM [facility]   HPI 68 y.o.female presents for follow up for anxiety, very acute anxiety. She states that she has been on zoloft since 12/01 with missing 2 doses, state she feels it is not helping, feels that she was more wound up at night. She feels compelled to move, hard time sitting still, very anxious, worries a lot compulsive thought. . Her vitamin D was low and she would like to be on prescription. All of her labs are normal.   Past Medical History  Diagnosis Date  . At high risk for altered glucose metabolism   . RLS (restless legs syndrome)   . Anemia   . Anxiety   . Depression   . Hypothyroidism      No Known Allergies    Current Outpatient Prescriptions on File Prior to Visit  Medication Sig Dispense Refill  . levothyroxine (SYNTHROID, LEVOTHROID) 50 MCG tablet Take 50 mcg by mouth daily before breakfast. Takes everyday except Sunday    . sertraline (ZOLOFT) 50 MG tablet Take 1 tablet (50 mg total) by mouth daily. 30 tablet 0  . Vitamin D, Ergocalciferol, (DRISDOL) 50000 UNITS CAPS capsule Take 1 capsule (50,000 Units total) by mouth every 7 (seven) days. 30 capsule 1   No current facility-administered medications on file prior to visit.    ROS: all negative except above.   Physical Exam: Filed Weights   06/01/14 1158  Weight: 122 lb (55.339 kg)   BP 102/60 mmHg  Pulse 88  Temp(Src) 98.1 F (36.7 C)  Resp 16  Ht 5\' 8"  (1.727 m)  Wt 122 lb (55.339 kg)  BMI 18.55 kg/m2 General Appearance: Well  nourished, in no apparent distress. Eyes: PERRLA, EOMs, conjunctiva no swelling or erythema Sinuses: No Frontal/maxillary tenderness ENT/Mouth: Ext aud canals clear, TMs without erythema, bulging. No erythema, swelling, or exudate on post pharynx.  Tonsils not swollen or erythematous. Hearing normal.  Neck: Supple, thyroid normal.  Respiratory: Respiratory effort normal, BS equal bilaterally without rales, rhonchi, wheezing or stridor.  Cardio: RRR with no MRGs. Brisk peripheral pulses without edema.  Abdomen: Soft, + BS.  Non tender, no guarding, rebound, hernias, masses. Lymphatics: Non tender without lymphadenopathy.  Musculoskeletal: Full ROM, 5/5 strength, normal gait.  Skin: Warm, dry without rashes, lesions, ecchymosis.  Neuro: Cranial nerves intact. Normal muscle tone, no cerebellar symptoms. Sensation intact.  Psych: Awake and oriented X 3, very anxious, can not sit still.

## 2014-06-02 ENCOUNTER — Telehealth: Payer: Self-pay | Admitting: Internal Medicine

## 2014-06-02 DIAGNOSIS — E890 Postprocedural hypothyroidism: Secondary | ICD-10-CM | POA: Diagnosis not present

## 2014-06-02 DIAGNOSIS — C73 Malignant neoplasm of thyroid gland: Secondary | ICD-10-CM | POA: Diagnosis not present

## 2014-06-02 DIAGNOSIS — R7301 Impaired fasting glucose: Secondary | ICD-10-CM | POA: Diagnosis not present

## 2014-06-02 NOTE — Telephone Encounter (Signed)
RECCOMMENDED  PT START 100MG  ZOLOFT IN AM WITH FOOD PER AMANDA COLLIER.    Thank you, Katrina Judeth Horn [facility] [phone] Fax [phone]

## 2014-06-15 ENCOUNTER — Ambulatory Visit: Payer: Self-pay | Admitting: Physician Assistant

## 2014-06-29 ENCOUNTER — Other Ambulatory Visit: Payer: Self-pay | Admitting: Internal Medicine

## 2014-06-29 ENCOUNTER — Other Ambulatory Visit: Payer: Self-pay

## 2014-06-29 DIAGNOSIS — Z1231 Encounter for screening mammogram for malignant neoplasm of breast: Secondary | ICD-10-CM

## 2014-06-29 DIAGNOSIS — Z780 Asymptomatic menopausal state: Secondary | ICD-10-CM

## 2014-06-30 ENCOUNTER — Ambulatory Visit (HOSPITAL_COMMUNITY): Payer: Medicare Other

## 2014-07-01 ENCOUNTER — Other Ambulatory Visit: Payer: Self-pay | Admitting: Physician Assistant

## 2014-07-01 ENCOUNTER — Ambulatory Visit (HOSPITAL_COMMUNITY)
Admission: RE | Admit: 2014-07-01 | Discharge: 2014-07-01 | Disposition: A | Discharge status: Home / Self Care | Payer: Medicare Other | Source: Ambulatory Visit | Attending: Internal Medicine | Admitting: Internal Medicine

## 2014-07-01 ENCOUNTER — Other Ambulatory Visit: Payer: Self-pay | Admitting: Internal Medicine

## 2014-07-01 DIAGNOSIS — Z780 Asymptomatic menopausal state: Secondary | ICD-10-CM

## 2014-07-01 DIAGNOSIS — Z1231 Encounter for screening mammogram for malignant neoplasm of breast: Secondary | ICD-10-CM

## 2014-07-01 DIAGNOSIS — Z13820 Encounter for screening for osteoporosis: Secondary | ICD-10-CM | POA: Diagnosis not present

## 2014-07-06 ENCOUNTER — Ambulatory Visit: Payer: Self-pay | Admitting: Physician Assistant

## 2014-07-07 ENCOUNTER — Other Ambulatory Visit: Payer: Self-pay | Admitting: Internal Medicine

## 2014-07-07 DIAGNOSIS — R928 Other abnormal and inconclusive findings on diagnostic imaging of breast: Secondary | ICD-10-CM

## 2014-07-11 ENCOUNTER — Other Ambulatory Visit: Payer: Self-pay

## 2014-07-11 ENCOUNTER — Other Ambulatory Visit: Payer: Self-pay | Admitting: Internal Medicine

## 2014-07-11 DIAGNOSIS — R928 Other abnormal and inconclusive findings on diagnostic imaging of breast: Secondary | ICD-10-CM

## 2014-07-15 ENCOUNTER — Other Ambulatory Visit: Payer: Medicare Other

## 2014-07-15 ENCOUNTER — Ambulatory Visit
Admission: RE | Admit: 2014-07-15 | Discharge: 2014-07-15 | Disposition: A | Discharge status: Home / Self Care | Payer: Medicare Other | Source: Ambulatory Visit | Attending: Internal Medicine | Admitting: Internal Medicine

## 2014-07-15 DIAGNOSIS — R928 Other abnormal and inconclusive findings on diagnostic imaging of breast: Secondary | ICD-10-CM

## 2014-07-15 DIAGNOSIS — N6001 Solitary cyst of right breast: Secondary | ICD-10-CM | POA: Diagnosis not present

## 2014-07-15 DIAGNOSIS — N63 Unspecified lump in breast: Secondary | ICD-10-CM | POA: Diagnosis not present

## 2014-08-12 ENCOUNTER — Ambulatory Visit (INDEPENDENT_AMBULATORY_CARE_PROVIDER_SITE_OTHER): Payer: Medicare Other | Admitting: Cardiology

## 2014-08-12 VITALS — BP 110/68 | HR 87 | Ht 68.0 in | Wt 128.4 lb

## 2014-08-12 DIAGNOSIS — R0600 Dyspnea, unspecified: Secondary | ICD-10-CM | POA: Diagnosis not present

## 2014-08-12 DIAGNOSIS — R0789 Other chest pain: Secondary | ICD-10-CM | POA: Diagnosis not present

## 2014-08-12 NOTE — Progress Notes (Signed)
   HPI The patient has no prior cardiac history. She does have a family history with her father having coronary disease in his 80s.   I did send her for a POET (Plain Old Exercise Treadmill)  In 2014. There was some borderline ST changes she was managed medically.  She returns for follow up.  She has been quite anxious about her cardiac risk over the last couple of years.  She does exercise routinely.  With this she denies an acute cardiovascular symptoms.  The patient denies any new symptoms such as chest discomfort, neck or arm discomfort. There has been no new shortness of breath, PND or orthopnea. There have been no reported palpitations, presyncope or syncope.  No Known Allergies  Current Outpatient Prescriptions  Medication Sig Dispense Refill  . levothyroxine (SYNTHROID, LEVOTHROID) 50 MCG tablet Take 50 mcg by mouth daily before breakfast. Takes everyday except Sunday    . Vitamin D, Ergocalciferol, (DRISDOL) 50000 UNITS CAPS capsule Take 1 capsule (50,000 Units total) by mouth every 7 (seven) days. 30 capsule 1   No current facility-administered medications for this visit.    Past Medical History  Diagnosis Date  . At high risk for altered glucose metabolism   . RLS (restless legs syndrome)   . Anemia   . Anxiety   . Depression   . Hypothyroidism     Past Surgical History  Procedure Laterality Date  . Thyroid cyst excision    . Tonsillectomy and adenoidectomy      ROS:  As stated in the HPI and negative for all other systems.  PHYSICAL EXAM BP 110/68 mmHg  Pulse 87  Ht 5\' 8"  (1.727 m)  Wt 128 lb 6.4 oz (58.242 kg)  BMI 19.53 kg/m2 GENERAL:  Well appearing NECK:  No jugular venous distention, waveform within normal limits, carotid upstroke brisk and symmetric, no bruits, no thyromegaly LUNGS:  Clear to auscultation bilaterally CHEST:  Unremarkable HEART:  PMI not displaced or sustained,S1 and S2 within normal limits, no S3, no S4, no clicks, no rubs, no murmurs ABD:   Flat, positive bowel sounds normal in frequency in pitch, no bruits, no rebound, no guarding, no midline pulsatile mass, no hepatomegaly, no splenomegaly EXT:  2 plus pulses throughout, no edema, no cyanosis no clubbing   EKG:  Sinus rhythm, rate 87, axis within normal limits, intervals within normal limits, no acute ST-T wave changes.   ASSESSMENT AND PLAN  FAMILY HISTORY OF CAD:  She had a low risk but equivocal stress test.  I think a calcium score would be helpful for risk stratification.  She agrees to this.    PALPITATIONS:  These are not particularly symptomatic. No further evaluation is indicated.

## 2014-08-12 NOTE — Patient Instructions (Signed)
Your physician recommends that you schedule a follow-up appointment in: as needed with Dr. Percival Spanish  We are ordering a ct calcium score for  You to get done

## 2014-08-14 ENCOUNTER — Encounter: Payer: Self-pay | Admitting: Cardiology

## 2014-08-15 ENCOUNTER — Ambulatory Visit (INDEPENDENT_AMBULATORY_CARE_PROVIDER_SITE_OTHER)
Admission: RE | Admit: 2014-08-15 | Discharge: 2014-08-15 | Disposition: A | Discharge status: Home / Self Care | Payer: Self-pay | Source: Ambulatory Visit | Attending: Cardiology | Admitting: Cardiology

## 2014-08-15 DIAGNOSIS — R0789 Other chest pain: Secondary | ICD-10-CM

## 2014-08-15 DIAGNOSIS — R0600 Dyspnea, unspecified: Secondary | ICD-10-CM

## 2014-08-15 HISTORY — DX: Malignant (primary) neoplasm, unspecified: C80.1

## 2014-08-22 ENCOUNTER — Encounter: Payer: Self-pay | Admitting: Internal Medicine

## 2014-08-22 ENCOUNTER — Telehealth: Payer: Self-pay | Admitting: Cardiology

## 2014-08-22 ENCOUNTER — Encounter: Payer: Self-pay | Admitting: *Deleted

## 2014-08-22 NOTE — Telephone Encounter (Signed)
Pt would like her CT results from 08-15-14 please.If not there please [phone].

## 2014-08-22 NOTE — Telephone Encounter (Signed)
Spoke with patient and provided results. Copy of results mailed to patient per her request.

## 2014-08-23 ENCOUNTER — Encounter: Payer: Self-pay | Admitting: Physician Assistant

## 2014-08-23 ENCOUNTER — Telehealth: Payer: Self-pay | Admitting: Cardiology

## 2014-08-23 DIAGNOSIS — R918 Other nonspecific abnormal finding of lung field: Secondary | ICD-10-CM | POA: Insufficient documentation

## 2014-08-23 NOTE — Telephone Encounter (Signed)
-----   Message from Minus Breeding, MD sent at 08/19/2014  2:05 PM EST ----- Call about CT.

## 2014-08-24 NOTE — Telephone Encounter (Signed)
Discussed results of calcium score.  The patient will have follow CT in one year.

## 2014-09-13 ENCOUNTER — Ambulatory Visit: Payer: Self-pay | Admitting: Physician Assistant

## 2014-09-29 LAB — HM HEPATITIS C SCREENING LAB: HM Hepatitis Screen: NEGATIVE

## 2014-10-04 ENCOUNTER — Encounter: Payer: Self-pay | Admitting: Physician Assistant

## 2014-10-04 ENCOUNTER — Ambulatory Visit (INDEPENDENT_AMBULATORY_CARE_PROVIDER_SITE_OTHER): Payer: Medicare Other | Admitting: Physician Assistant

## 2014-10-04 VITALS — BP 108/64 | HR 76 | Temp 98.0°F | Resp 16 | Ht 68.0 in | Wt 126.0 lb

## 2014-10-04 DIAGNOSIS — F419 Anxiety disorder, unspecified: Secondary | ICD-10-CM

## 2014-10-04 MED ORDER — ESCITALOPRAM OXALATE 10 MG PO TABS: 10.0000 mg | ORAL_TABLET | Freq: Every day | ORAL | Status: DC

## 2014-10-04 NOTE — Progress Notes (Signed)
   Subjective:    Patient ID: Debra Hayes, female    DOB: 25-May-1946, 69 y.o.   MRN: [id number]  HPI 69 y.o.female presents for follow up for anxiety, very acute anxiety. She has been very adverse to medications in the past, did try zoloft for very short time without help, she has been seeing counseling and would like to try to go on medication again. She feels compelled to move, hard time sitting still, very anxious, worries a lot compulsive thought, occ hot flashes.   Review of Systems  Constitutional: Negative.   HENT: Negative.   Respiratory: Negative.   Cardiovascular: Positive for palpitations. Negative for chest pain and leg swelling.  Genitourinary: Negative.   Musculoskeletal: Negative.   Neurological: Negative.   Psychiatric/Behavioral: Positive for sleep disturbance and dysphoric mood. Negative for suicidal ideas, hallucinations, behavioral problems, confusion, self-injury, decreased concentration and agitation. The patient is nervous/anxious. The patient is not hyperactive.        Objective:   Physical Exam General Appearance: Well nourished, in no apparent distress. Eyes: PERRLA, EOMs, conjunctiva no swelling or erythema Sinuses: No Frontal/maxillary tenderness ENT/Mouth: Ext aud canals clear, TMs without erythema, bulging. No erythema, swelling, or exudate on post pharynx.  Tonsils not swollen or erythematous. Hearing normal.  Neck: Supple, thyroid normal.  Respiratory: Respiratory effort normal, BS equal bilaterally without rales, rhonchi, wheezing or stridor.  Cardio: RRR with no MRGs. Brisk peripheral pulses without edema.  Abdomen: Soft, + BS.  Non tender, no guarding, rebound, hernias, masses. Lymphatics: Non tender without lymphadenopathy.  Musculoskeletal: Full ROM, 5/5 strength, normal gait.  Skin: Warm, dry without rashes, lesions, ecchymosis.  Neuro: Cranial nerves intact. Normal muscle tone, no cerebellar symptoms. Sensation intact.  Psych: Awake and  oriented X 3, very anxious, can not sit still.      Assessment & Plan:  Anxiety- willing to try lexapro 10mg  after long discussion with her therapist, will start low and go slow, follow up in 1 month with labs.

## 2014-10-04 NOTE — Patient Instructions (Signed)
Start on the lexapro 10mg   Start on 1/2 pill in the morning with food for 1.5 weeks.  On May 1st start on 1 pill in the morning with food.  I think you will do well on the 10 mg daily  Follow up in 6 weeks.   Escitalopram tablets What is this medicine? ESCITALOPRAM (es sye TAL oh pram) is used to treat depression and certain types of anxiety. This medicine may be used for other purposes; ask your health care provider or pharmacist if you have questions. COMMON BRAND NAME(S): Lexapro How should I use this medicine? Take this medicine by mouth with a glass of water. Follow the directions on the prescription label. You can take it with or without food. If it upsets your stomach, take it with food. Take your medicine at regular intervals. Do not take it more often than directed. \ What if I miss a dose? If you miss a dose, take it as soon as you can. If it is almost time for your next dose, take only that dose. Do not take double or extra doses.

## 2014-11-04 ENCOUNTER — Ambulatory Visit (INDEPENDENT_AMBULATORY_CARE_PROVIDER_SITE_OTHER): Payer: BLUE CROSS/BLUE SHIELD | Admitting: Physician Assistant

## 2014-11-04 ENCOUNTER — Encounter: Payer: Self-pay | Admitting: Physician Assistant

## 2014-11-04 VITALS — BP 110/68 | HR 56 | Temp 97.7°F | Resp 16 | Ht 68.0 in | Wt 128.0 lb

## 2014-11-04 DIAGNOSIS — H6123 Impacted cerumen, bilateral: Secondary | ICD-10-CM | POA: Diagnosis not present

## 2014-11-04 DIAGNOSIS — E559 Vitamin D deficiency, unspecified: Secondary | ICD-10-CM | POA: Diagnosis not present

## 2014-11-04 DIAGNOSIS — F419 Anxiety disorder, unspecified: Secondary | ICD-10-CM | POA: Diagnosis not present

## 2014-11-04 DIAGNOSIS — Z79899 Other long term (current) drug therapy: Secondary | ICD-10-CM | POA: Diagnosis not present

## 2014-11-04 DIAGNOSIS — E039 Hypothyroidism, unspecified: Secondary | ICD-10-CM | POA: Diagnosis not present

## 2014-11-04 DIAGNOSIS — R7303 Prediabetes: Secondary | ICD-10-CM

## 2014-11-04 DIAGNOSIS — R7309 Other abnormal glucose: Secondary | ICD-10-CM

## 2014-11-04 LAB — CBC WITH DIFFERENTIAL/PLATELET
BASOS ABS: 0 10*3/uL (ref 0.0–0.1)
Basophils Relative: 0 % (ref 0–1)
EOS ABS: 0.1 10*3/uL (ref 0.0–0.7)
Eosinophils Relative: 1 % (ref 0–5)
HCT: 44.6 % (ref 36.0–46.0)
HEMOGLOBIN: 15.2 g/dL — AB (ref 12.0–15.0)
LYMPHS PCT: 16 % (ref 12–46)
Lymphs Abs: 1.3 10*3/uL (ref 0.7–4.0)
MCH: 31 pg (ref 26.0–34.0)
MCHC: 34.1 g/dL (ref 30.0–36.0)
MCV: 91 fL (ref 78.0–100.0)
MPV: 10.5 fL (ref 8.6–12.4)
Monocytes Absolute: 0.4 10*3/uL (ref 0.1–1.0)
Monocytes Relative: 5 % (ref 3–12)
Neutro Abs: 6.5 10*3/uL (ref 1.7–7.7)
Neutrophils Relative %: 78 % — ABNORMAL HIGH (ref 43–77)
Platelets: 281 10*3/uL (ref 150–400)
RBC: 4.9 MIL/uL (ref 3.87–5.11)
RDW: 13.5 % (ref 11.5–15.5)
WBC: 8.3 10*3/uL (ref 4.0–10.5)

## 2014-11-04 LAB — BASIC METABOLIC PANEL WITH GFR
BUN: 17 mg/dL (ref 6–23)
CO2: 29 meq/L (ref 19–32)
CREATININE: 0.84 mg/dL (ref 0.50–1.10)
Calcium: 9.1 mg/dL (ref 8.4–10.5)
Chloride: 103 mEq/L (ref 96–112)
GFR, EST NON AFRICAN AMERICAN: 72 mL/min
GFR, Est African American: 83 mL/min
Glucose, Bld: 122 mg/dL — ABNORMAL HIGH (ref 70–99)
Potassium: 4.3 mEq/L (ref 3.5–5.3)
Sodium: 141 mEq/L (ref 135–145)

## 2014-11-04 LAB — HEPATIC FUNCTION PANEL
ALK PHOS: 62 U/L (ref 39–117)
ALT: 18 U/L (ref 0–35)
AST: 20 U/L (ref 0–37)
Albumin: 4.1 g/dL (ref 3.5–5.2)
Bilirubin, Direct: 0.1 mg/dL (ref 0.0–0.3)
Indirect Bilirubin: 0.5 mg/dL (ref 0.2–1.2)
TOTAL PROTEIN: 6.5 g/dL (ref 6.0–8.3)
Total Bilirubin: 0.6 mg/dL (ref 0.2–1.2)

## 2014-11-04 LAB — TSH: TSH: 1.645 u[IU]/mL (ref 0.350–4.500)

## 2014-11-04 LAB — MAGNESIUM: Magnesium: 2.1 mg/dL (ref 1.5–2.5)

## 2014-11-04 MED ORDER — ESCITALOPRAM OXALATE 10 MG PO TABS: 10.0000 mg | ORAL_TABLET | Freq: Every day | ORAL | Status: DC

## 2014-11-04 NOTE — Patient Instructions (Signed)
Please increase to 10mg  daily of the lexapro You can do 10mg  tomorrow and then the other 5mg  half and THEN start EVERY DAY of the lexapro 10mg    Please call if you have any issues.   We will call about your labs.   Please continue counseling and we will see you back in 1 month.

## 2014-11-04 NOTE — Progress Notes (Signed)
Assessment and Plan: 1. Anxiety Start lexapro 10mg   2. Hypothyroidism, unspecified hypothyroidism type Hypothyroidism-check TSH level, continue medications the same, reminded to take on an empty stomach 30-2mins before food.  - TSH  3. Prediabetes Discussed general issues about diabetes pathophysiology and management., Educational material distributed., Suggested low cholesterol diet., Encouraged aerobic exercise., Discussed foot care., Reminded to get yearly retinal exam. - CBC with Differential/Platelet - BASIC METABOLIC PANEL WITH GFR - Hepatic function panel - Hemoglobin A1c  4. Vitamin D deficiency  5. Medication management - Magnesium    HPI 69 y.o.female presents for follow up 1 month for addition of lexapro for anxiety/depression. She has only been on the 5mg  daily and has not tried the 10 mg, she has not had any AE's. Some mild increased diarrhea however this has resolved. She continues to see counseling and states it has helped, she has started to volunteer at the [employer].  She is on vitamin D for def, 50,000 once a week.  Lab Results  Component Value Date   VD25OH 38 05/17/2014  She is on thyroid medication. Her medication was not changed last visit.   She has PreDM, denies DM polys, hypoglyemia. Has been working out and eating better.  Lab Results  Component Value Date   HGBA1C 6.3* 05/17/2014      Past Medical History  Diagnosis Date  . At high risk for altered glucose metabolism   . RLS (restless legs syndrome)   . Anemia   . Anxiety   . Depression   . Hypothyroidism   . Cancer      No Known Allergies    Current Outpatient Prescriptions on File Prior to Visit  Medication Sig Dispense Refill  . escitalopram (LEXAPRO) 10 MG tablet Take 1 tablet (10 mg total) by mouth daily. 30 tablet 2  . levothyroxine (SYNTHROID, LEVOTHROID) 50 MCG tablet Take 50 mcg by mouth daily before breakfast. Takes everyday except Sunday    . Vitamin D,  Ergocalciferol, (DRISDOL) 50000 UNITS CAPS capsule Take 1 capsule (50,000 Units total) by mouth every 7 (seven) days. 30 capsule 1   No current facility-administered medications on file prior to visit.    ROS: all negative except above.   Physical Exam: Filed Weights   11/04/14 1010  Weight: 128 lb (58.06 kg)   BP 110/68 mmHg  Pulse 56  Temp(Src) 97.7 F (36.5 C)  Resp 16  Wt 128 lb (58.06 kg) General Appearance: Well nourished, in no apparent distress. Eyes: PERRLA, EOMs, conjunctiva no swelling or erythema Sinuses: No Frontal/maxillary tenderness ENT/Mouth: Ext aud canals clear, TMs without erythema, bulging. No erythema, swelling, or exudate on post pharynx.  Tonsils not swollen or erythematous. Hearing normal.  Neck: Supple, thyroid normal.  Respiratory: Respiratory effort normal, BS equal bilaterally without rales, rhonchi, wheezing or stridor.  Cardio: RRR with no MRGs. Brisk peripheral pulses without edema.  Abdomen: Soft, + BS.  Non tender, no guarding, rebound, hernias, masses. Lymphatics: Non tender without lymphadenopathy.  Musculoskeletal: Full ROM, 5/5 strength, normal gait.  Skin: Warm, dry without rashes, lesions, ecchymosis.  Neuro: Cranial nerves intact. Normal muscle tone, no cerebellar symptoms. Sensation intact.  Psych: Awake and oriented X 3, normal affect, Insight and Judgment appropriate.     Vicie Mutters, PA-C 10:18 AM [facility]

## 2014-11-05 LAB — HEMOGLOBIN A1C
Hgb A1c MFr Bld: 6.2 % — ABNORMAL HIGH (ref <5.7)
MEAN PLASMA GLUCOSE: 131 mg/dL — AB (ref <117)

## 2014-12-05 ENCOUNTER — Encounter: Payer: Self-pay | Admitting: Physician Assistant

## 2014-12-05 ENCOUNTER — Ambulatory Visit (INDEPENDENT_AMBULATORY_CARE_PROVIDER_SITE_OTHER): Payer: Medicare Other | Admitting: Physician Assistant

## 2014-12-05 VITALS — BP 102/72 | HR 72 | Temp 97.7°F | Resp 16 | Ht 68.0 in | Wt 134.0 lb

## 2014-12-05 DIAGNOSIS — F419 Anxiety disorder, unspecified: Secondary | ICD-10-CM | POA: Diagnosis not present

## 2014-12-05 NOTE — Patient Instructions (Signed)
Generalized Anxiety Disorder Generalized anxiety disorder (GAD) is a mental disorder. It interferes with life functions, including relationships, work, and school. GAD is different from normal anxiety, which everyone experiences at some point in their lives in response to specific life events and activities. Normal anxiety actually helps us prepare for and get through these life events and activities. Normal anxiety goes away after the event or activity is over.  GAD causes anxiety that is not necessarily related to specific events or activities. It also causes excess anxiety in proportion to specific events or activities. The anxiety associated with GAD is also difficult to control. GAD can vary from mild to severe. People with severe GAD can have intense waves of anxiety with physical symptoms (panic attacks).  SYMPTOMS The anxiety and worry associated with GAD are difficult to control. This anxiety and worry are related to many life events and activities and also occur more days than not for 6 months or longer. People with GAD also have three or more of the following symptoms (one or more in children):  Restlessness.   Fatigue.  Difficulty concentrating.   Irritability.  Muscle tension.  Difficulty sleeping or unsatisfying sleep. DIAGNOSIS GAD is diagnosed through an assessment by your health care provider. Your health care provider will ask you questions aboutyour mood,physical symptoms, and events in your life. Your health care provider may ask you about your medical history and use of alcohol or drugs, including prescription medicines. Your health care provider may also do a physical exam and blood tests. Certain medical conditions and the use of certain substances can cause symptoms similar to those associated with GAD. Your health care provider may refer you to a mental health specialist for further evaluation. TREATMENT The following therapies are usually used to treat GAD:    Medication. Antidepressant medication usually is prescribed for long-term daily control. Antianxiety medicines may be added in severe cases, especially when panic attacks occur.   Talk therapy (psychotherapy). Certain types of talk therapy can be helpful in treating GAD by providing support, education, and guidance. A form of talk therapy called cognitive behavioral therapy can teach you healthy ways to think about and react to daily life events and activities.  Stress managementtechniques. These include yoga, meditation, and exercise and can be very helpful when they are practiced regularly. A mental health specialist can help determine which treatment is best for you. Some people see improvement with one therapy. However, other people require a combination of therapies. Document Released: 09/28/2012 Document Revised: 10/18/2013 Document Reviewed: 09/28/2012 Exit[employer] Patient Information 2015 [employer]. This information is not intended to replace advice given to you by your health care provider. Make sure you discuss any questions you have with your health care provider.  

## 2014-12-05 NOTE — Progress Notes (Signed)
   Subjective:    Patient ID: Debra Hayes, female    DOB: November 15, 1945, 69 y.o.   MRN: [id number]  HPI 69 y.o.female presents for follow up for anxiety. She has been very adverse to medications in the past, did try zoloft for very short time without help, she has been seeing counseling and was finally willing to try medication last visit. She was on lexapro 5 mg and increased to 10 mg daily last visit.  She  is working out, she is volunteering at the [employer]. She states she is feeling much better, having less diarrhea, better sleeping, less anxiety.   Current Outpatient Prescriptions on File Prior to Visit  Medication Sig Dispense Refill  . escitalopram (LEXAPRO) 10 MG tablet Take 1 tablet (10 mg total) by mouth daily. 30 tablet 2  . levothyroxine (SYNTHROID, LEVOTHROID) 50 MCG tablet Take 50 mcg by mouth daily before breakfast. Takes everyday except Sunday    . Vitamin D, Ergocalciferol, (DRISDOL) 50000 UNITS CAPS capsule Take 1 capsule (50,000 Units total) by mouth every 7 (seven) days. 30 capsule 1   No current facility-administered medications on file prior to visit.    Past Medical History  Diagnosis Date  . At high risk for altered glucose metabolism   . RLS (restless legs syndrome)   . Anemia   . Anxiety   . Depression   . Hypothyroidism   . Cancer     Review of Systems  Constitutional: Negative.   HENT: Negative.   Respiratory: Negative.   Cardiovascular: Positive for palpitations. Negative for chest pain and leg swelling.  Genitourinary: Negative.   Musculoskeletal: Negative.   Neurological: Negative.   Psychiatric/Behavioral: Positive for sleep disturbance and dysphoric mood. Negative for suicidal ideas, hallucinations, behavioral problems, confusion, self-injury, decreased concentration and agitation. The patient is nervous/anxious. The patient is not hyperactive.        Objective:   Physical Exam General Appearance: Well nourished, in no apparent  distress. Eyes: PERRLA, EOMs, conjunctiva no swelling or erythema Sinuses: No Frontal/maxillary tenderness ENT/Mouth: Ext aud canals clear, TMs without erythema, bulging. No erythema, swelling, or exudate on post pharynx.  Tonsils not swollen or erythematous. Hearing normal.  Neck: Supple, thyroid normal.  Respiratory: Respiratory effort normal, BS equal bilaterally without rales, rhonchi, wheezing or stridor.  Cardio: RRR with no MRGs. Brisk peripheral pulses without edema.  Abdomen: Soft, + BS.  Non tender, no guarding, rebound, hernias, masses. Lymphatics: Non tender without lymphadenopathy.  Musculoskeletal: Full ROM, 5/5 strength, normal gait.  Skin: Warm, dry without rashes, lesions, ecchymosis.  Neuro: Cranial nerves intact. Normal muscle tone, no cerebellar symptoms. Sensation intact.  Psych: Awake and oriented X 3.     Assessment & Plan:  Anxiety-  Continue the lexapro 10mg , counseling and follow up 3-4 months Doing very well Will follow up at next OV

## 2015-03-20 ENCOUNTER — Encounter: Payer: Self-pay | Admitting: Internal Medicine

## 2015-03-20 ENCOUNTER — Ambulatory Visit (INDEPENDENT_AMBULATORY_CARE_PROVIDER_SITE_OTHER): Payer: BLUE CROSS/BLUE SHIELD | Admitting: Internal Medicine

## 2015-03-20 VITALS — BP 100/62 | HR 72 | Temp 97.7°F | Resp 16 | Ht 68.0 in | Wt 132.2 lb

## 2015-03-20 DIAGNOSIS — E782 Mixed hyperlipidemia: Secondary | ICD-10-CM | POA: Insufficient documentation

## 2015-03-20 DIAGNOSIS — R7309 Other abnormal glucose: Secondary | ICD-10-CM | POA: Insufficient documentation

## 2015-03-20 DIAGNOSIS — Z6820 Body mass index (BMI) 20.0-20.9, adult: Secondary | ICD-10-CM | POA: Diagnosis not present

## 2015-03-20 DIAGNOSIS — Z79899 Other long term (current) drug therapy: Secondary | ICD-10-CM

## 2015-03-20 DIAGNOSIS — R030 Elevated blood-pressure reading, without diagnosis of hypertension: Secondary | ICD-10-CM | POA: Insufficient documentation

## 2015-03-20 DIAGNOSIS — E559 Vitamin D deficiency, unspecified: Secondary | ICD-10-CM

## 2015-03-20 DIAGNOSIS — R7303 Prediabetes: Secondary | ICD-10-CM

## 2015-03-20 DIAGNOSIS — IMO0001 Reserved for inherently not codable concepts without codable children: Secondary | ICD-10-CM

## 2015-03-20 LAB — CBC WITH DIFFERENTIAL/PLATELET
BASOS ABS: 0 10*3/uL (ref 0.0–0.1)
BASOS PCT: 0 % (ref 0–1)
EOS ABS: 0.1 10*3/uL (ref 0.0–0.7)
Eosinophils Relative: 1 % (ref 0–5)
HCT: 41.7 % (ref 36.0–46.0)
Hemoglobin: 14.4 g/dL (ref 12.0–15.0)
LYMPHS ABS: 1.8 10*3/uL (ref 0.7–4.0)
Lymphocytes Relative: 25 % (ref 12–46)
MCH: 31.1 pg (ref 26.0–34.0)
MCHC: 34.5 g/dL (ref 30.0–36.0)
MCV: 90.1 fL (ref 78.0–100.0)
MPV: 10.7 fL (ref 8.6–12.4)
Monocytes Absolute: 0.6 10*3/uL (ref 0.1–1.0)
Monocytes Relative: 9 % (ref 3–12)
NEUTROS PCT: 65 % (ref 43–77)
Neutro Abs: 4.6 10*3/uL (ref 1.7–7.7)
Platelets: 240 10*3/uL (ref 150–400)
RBC: 4.63 MIL/uL (ref 3.87–5.11)
RDW: 13.6 % (ref 11.5–15.5)
WBC: 7.1 10*3/uL (ref 4.0–10.5)

## 2015-03-20 LAB — HEMOGLOBIN A1C
Hgb A1c MFr Bld: 6 % — ABNORMAL HIGH (ref <5.7)
Mean Plasma Glucose: 126 mg/dL — ABNORMAL HIGH (ref <117)

## 2015-03-20 LAB — BASIC METABOLIC PANEL WITH GFR
BUN: 17 mg/dL (ref 7–25)
CHLORIDE: 105 mmol/L (ref 98–110)
CO2: 26 mmol/L (ref 20–31)
Calcium: 9.3 mg/dL (ref 8.6–10.4)
Creat: 0.94 mg/dL (ref 0.50–0.99)
GFR, EST NON AFRICAN AMERICAN: 63 mL/min (ref >=60)
GFR, Est African American: 72 mL/min (ref >=60)
Glucose, Bld: 98 mg/dL (ref 65–99)
POTASSIUM: 4.1 mmol/L (ref 3.5–5.3)
Sodium: 139 mmol/L (ref 135–146)

## 2015-03-20 LAB — HEPATIC FUNCTION PANEL
ALK PHOS: 67 U/L (ref 33–130)
ALT: 17 U/L (ref 6–29)
AST: 25 U/L (ref 10–35)
Albumin: 4.5 g/dL (ref 3.6–5.1)
BILIRUBIN DIRECT: 0.1 mg/dL (ref <=0.2)
BILIRUBIN TOTAL: 0.4 mg/dL (ref 0.2–1.2)
Indirect Bilirubin: 0.3 mg/dL (ref 0.2–1.2)
Total Protein: 6.6 g/dL (ref 6.1–8.1)

## 2015-03-20 LAB — LIPID PANEL
Cholesterol: 216 mg/dL — ABNORMAL HIGH (ref 125–200)
HDL: 66 mg/dL (ref >=46)
LDL CALC: 96 mg/dL (ref <130)
Total CHOL/HDL Ratio: 3.3 Ratio (ref <=5.0)
Triglycerides: 270 mg/dL — ABNORMAL HIGH (ref <150)
VLDL: 54 mg/dL — ABNORMAL HIGH (ref <30)

## 2015-03-20 LAB — MAGNESIUM: Magnesium: 2.3 mg/dL (ref 1.5–2.5)

## 2015-03-20 NOTE — Patient Instructions (Signed)

## 2015-03-20 NOTE — Progress Notes (Signed)
Patient ID: Debra Hayes, female   DOB: 1946/06/04, 69 y.o.   MRN: [id number]   This very nice 69 y.o. [name] presents for follow up with Hypertension, Hyperlipidemia, Pre-Diabetes, Hypothyroidism  and Vitamin D Deficiency. Patient had previously taking Lexapro for dysthymic mood disorder and has been off of her medication & desires to restart it.    Patient is monitored expectantly for elevated BP which has been controlled and today's BP: 100/62 mmHg. Patient has had no complaints of any cardiac type chest pain, palpitations, dyspnea/orthopnea/PND, dizziness, claudication, or dependent edema.   Hyperlipidemia is controlled with diet. Patient denies myalgias or other med SE's. Last Lipids weremarginally at goal with Cholesterol 201; HDL 70; LDL 101; & Triglycerides 148 on 05/17/2014:   Also, the patient has history of PreDiabetes since Aug 2013 with A1c 5.9% and has had no symptoms of reactive hypoglycemia, diabetic polys, paresthesias or visual blurring.  Last A1c was 6.2% on 11/04/2014.    Patient has been on thyroid replacement since thyroid surgery for goiter in 1991.  Further, the patient also has history of Vitamin D Deficiency of 35 in Nov 2010 and supplements vitamin D without any suspected side-effects, altho she relates she has been out of her medicine for several weeks. Last vitamin D was still low at 38 on 05/17/2014.  Medication Sig  . levothyroxine  50 MCG tablet Take 50 mcg by mouth daily before breakfast. Takes everyday except Sunday  . Vitamin D 50,000 UNITS  Take 1 cap every 7  Days. - Off x several weeks.  Marland Kitchen escitalopram (LEXAPRO) 10 MG tablet Take 1 tab daily. Patient not taking x several days   No Known Allergies  PMHx:   Past Medical History  Diagnosis Date  . At high risk for altered glucose metabolism   . RLS (restless legs syndrome)   . Anemia   . Anxiety   . Depression   . Hypothyroidism   . Cancer [facility])    Immunization History  Administered Date(s) Administered   . Tdap 06/17/2006   Past Surgical History  Procedure Laterality Date  . Thyroid cyst excision    . Tonsillectomy and adenoidectomy     FHx:    Reviewed / unchanged  SHx:    Reviewed / unchanged  Systems Review:  Constitutional: Denies fever, chills, wt changes, headaches, insomnia, fatigue, night sweats, change in appetite. Eyes: Denies redness, blurred vision, diplopia, discharge, itchy, watery eyes.  ENT: Denies discharge, congestion, post nasal drip, epistaxis, sore throat, earache, hearing loss, dental pain, tinnitus, vertigo, sinus pain, snoring.  CV: Denies chest pain, palpitations, irregular heartbeat, syncope, dyspnea, diaphoresis, orthopnea, PND, claudication or edema. Respiratory: denies cough, dyspnea, DOE, pleurisy, hoarseness, laryngitis, wheezing.  Gastrointestinal: Denies dysphagia, odynophagia, heartburn, reflux, water brash, abdominal pain or cramps, nausea, vomiting, bloating, diarrhea, constipation, hematemesis, melena, hematochezia  or hemorrhoids. Genitourinary: Denies dysuria, frequency, urgency, nocturia, hesitancy, discharge, hematuria or flank pain. Musculoskeletal: Denies arthralgias, myalgias, stiffness, jt. swelling, pain, limping or strain/sprain.  Skin: Denies pruritus, rash, hives, warts, acne, eczema or change in skin lesion(s). Neuro: No weakness, tremor, incoordination, spasms, paresthesia or pain. Psychiatric: Denies confusion, memory loss or sensory loss. Endo: Denies change in weight, skin or hair change.  Heme/Lymph: No excessive bleeding, bruising or enlarged lymph nodes.  Physical Exam  BP 100/62 mmHg  Pulse 72  Temp(Src) 97.7 F (36.5 C)  Resp 16  Ht 5\' 8"  (1.727 m)  Wt 132 lb 3.2 oz (59.966 kg)  BMI 20.11 kg/m2  Appears well  nourished and in no distress. Eyes: PERRLA, EOMs, conjunctiva no swelling or erythema. Sinuses: No frontal/maxillary tenderness ENT/Mouth: EAC's clear, TM's nl w/o erythema, bulging. Nares clear w/o erythema,  swelling, exudates. Oropharynx clear without erythema or exudates. Oral hygiene is good. Tongue normal, non obstructing. Hearing intact.  Neck: Supple. Thyroid nl. Car 2+/2+ without bruits, nodes or JVD. Chest: Respirations nl with BS clear & equal w/o rales, rhonchi, wheezing or stridor.  Cor: Heart sounds normal w/ regular rate and rhythm without sig. murmurs, gallops, clicks, or rubs. Peripheral pulses normal and equal  without edema.  Abdomen: Soft & bowel sounds normal. Non-tender w/o guarding, rebound, hernias, masses, or organomegaly.  Lymphatics: Unremarkable.  Musculoskeletal: Full ROM all peripheral extremities, joint stability, 5/5 strength, and normal gait.  Skin: Warm, dry without exposed rashes, lesions or ecchymosis apparent.  Neuro: Cranial nerves intact, reflexes equal bilaterally. Sensory-motor testing grossly intact. Tendon reflexes grossly intact.  Pysch: Alert & oriented x 3.  Insight and judgement nl & appropriate. No ideations.  Assessment and Plan:  1. Elevated BP  - Expectant monitoring  2. Mixed hyperlipidemia  - Lipid panel - TSH  3. Prediabetes  - Hemoglobin A1c - Insulin, random  4. Vitamin D deficiency disease  - Vit D  25 hydroxy (  5. Body mass index (BMI) of 20.0-20.9 in adult   6. Medication management  - CBC with Differential/Platelet - BASIC METABOLIC PANEL WITH GFR - Hepatic function panel - Magnesium  7. Depression, controlled  - encouraged patient to restart her Lexapro   Recommended regular exercise, BP monitoring, weight control, and discussed med and SE's. Recommended labs to assess and monitor clinical status. Further disposition pending results of labs. Over 30 minutes of exam, counseling, chart review was performed

## 2015-03-21 LAB — INSULIN, RANDOM: Insulin: 15.3 u[IU]/mL (ref 2.0–19.6)

## 2015-03-21 LAB — TSH: TSH: 1.896 u[IU]/mL (ref 0.350–4.500)

## 2015-03-21 LAB — VITAMIN D 25 HYDROXY (VIT D DEFICIENCY, FRACTURES): VIT D 25 HYDROXY: 29 ng/mL — AB (ref 30–100)

## 2015-04-09 ENCOUNTER — Other Ambulatory Visit: Payer: Self-pay | Admitting: Physician Assistant

## 2015-04-26 DIAGNOSIS — D1801 Hemangioma of skin and subcutaneous tissue: Secondary | ICD-10-CM | POA: Diagnosis not present

## 2015-04-26 DIAGNOSIS — L853 Xerosis cutis: Secondary | ICD-10-CM | POA: Diagnosis not present

## 2015-04-26 DIAGNOSIS — D225 Melanocytic nevi of trunk: Secondary | ICD-10-CM | POA: Diagnosis not present

## 2015-04-26 DIAGNOSIS — L821 Other seborrheic keratosis: Secondary | ICD-10-CM | POA: Diagnosis not present

## 2015-04-26 DIAGNOSIS — L814 Other melanin hyperpigmentation: Secondary | ICD-10-CM | POA: Diagnosis not present

## 2015-06-22 DIAGNOSIS — R7301 Impaired fasting glucose: Secondary | ICD-10-CM | POA: Diagnosis not present

## 2015-06-22 DIAGNOSIS — E890 Postprocedural hypothyroidism: Secondary | ICD-10-CM | POA: Diagnosis not present

## 2015-06-26 ENCOUNTER — Encounter: Payer: Self-pay | Admitting: Physician Assistant

## 2015-06-29 ENCOUNTER — Ambulatory Visit (INDEPENDENT_AMBULATORY_CARE_PROVIDER_SITE_OTHER): Payer: Medicare Other | Admitting: Physician Assistant

## 2015-06-29 ENCOUNTER — Encounter: Payer: Self-pay | Admitting: Physician Assistant

## 2015-06-29 VITALS — BP 100/70 | HR 64 | Temp 98.1°F | Ht 69.5 in | Wt 138.0 lb

## 2015-06-29 DIAGNOSIS — Z0000 Encounter for general adult medical examination without abnormal findings: Secondary | ICD-10-CM

## 2015-06-29 DIAGNOSIS — Z0001 Encounter for general adult medical examination with abnormal findings: Secondary | ICD-10-CM | POA: Diagnosis not present

## 2015-06-29 DIAGNOSIS — F419 Anxiety disorder, unspecified: Secondary | ICD-10-CM

## 2015-06-29 DIAGNOSIS — R918 Other nonspecific abnormal finding of lung field: Secondary | ICD-10-CM | POA: Diagnosis not present

## 2015-06-29 DIAGNOSIS — R6889 Other general symptoms and signs: Secondary | ICD-10-CM | POA: Diagnosis not present

## 2015-06-29 DIAGNOSIS — E559 Vitamin D deficiency, unspecified: Secondary | ICD-10-CM

## 2015-06-29 DIAGNOSIS — C73 Malignant neoplasm of thyroid gland: Secondary | ICD-10-CM | POA: Diagnosis not present

## 2015-06-29 DIAGNOSIS — Z79899 Other long term (current) drug therapy: Secondary | ICD-10-CM | POA: Diagnosis not present

## 2015-06-29 DIAGNOSIS — IMO0001 Reserved for inherently not codable concepts without codable children: Secondary | ICD-10-CM

## 2015-06-29 DIAGNOSIS — R030 Elevated blood-pressure reading, without diagnosis of hypertension: Secondary | ICD-10-CM | POA: Diagnosis not present

## 2015-06-29 DIAGNOSIS — E782 Mixed hyperlipidemia: Secondary | ICD-10-CM

## 2015-06-29 DIAGNOSIS — E039 Hypothyroidism, unspecified: Secondary | ICD-10-CM

## 2015-06-29 DIAGNOSIS — R7309 Other abnormal glucose: Secondary | ICD-10-CM

## 2015-06-29 DIAGNOSIS — E890 Postprocedural hypothyroidism: Secondary | ICD-10-CM | POA: Diagnosis not present

## 2015-06-29 DIAGNOSIS — R7301 Impaired fasting glucose: Secondary | ICD-10-CM | POA: Diagnosis not present

## 2015-06-29 DIAGNOSIS — D649 Anemia, unspecified: Secondary | ICD-10-CM | POA: Diagnosis not present

## 2015-06-29 LAB — HEPATIC FUNCTION PANEL
ALBUMIN: 4.4 g/dL (ref 3.6–5.1)
ALK PHOS: 64 U/L (ref 33–130)
ALT: 19 U/L (ref 6–29)
AST: 24 U/L (ref 10–35)
Bilirubin, Direct: 0.1 mg/dL (ref <=0.2)
Indirect Bilirubin: 0.4 mg/dL (ref 0.2–1.2)
Total Bilirubin: 0.5 mg/dL (ref 0.2–1.2)
Total Protein: 6.5 g/dL (ref 6.1–8.1)

## 2015-06-29 LAB — CBC WITH DIFFERENTIAL/PLATELET
BASOS PCT: 0 % (ref 0–1)
Basophils Absolute: 0 10*3/uL (ref 0.0–0.1)
EOS ABS: 0.2 10*3/uL (ref 0.0–0.7)
Eosinophils Relative: 3 % (ref 0–5)
HCT: 43.2 % (ref 36.0–46.0)
Hemoglobin: 14.3 g/dL (ref 12.0–15.0)
LYMPHS ABS: 2 10*3/uL (ref 0.7–4.0)
Lymphocytes Relative: 37 % (ref 12–46)
MCH: 30.4 pg (ref 26.0–34.0)
MCHC: 33.1 g/dL (ref 30.0–36.0)
MCV: 91.7 fL (ref 78.0–100.0)
MPV: 10.6 fL (ref 8.6–12.4)
Monocytes Absolute: 0.4 10*3/uL (ref 0.1–1.0)
Monocytes Relative: 7 % (ref 3–12)
Neutro Abs: 2.9 10*3/uL (ref 1.7–7.7)
Neutrophils Relative %: 53 % (ref 43–77)
PLATELETS: 266 10*3/uL (ref 150–400)
RBC: 4.71 MIL/uL (ref 3.87–5.11)
RDW: 13.1 % (ref 11.5–15.5)
WBC: 5.4 10*3/uL (ref 4.0–10.5)

## 2015-06-29 LAB — IRON AND TIBC
%SAT: 26 % (ref 11–50)
Iron: 89 ug/dL (ref 45–160)
TIBC: 344 ug/dL (ref 250–450)
UIBC: 255 ug/dL (ref 125–400)

## 2015-06-29 LAB — LIPID PANEL
CHOLESTEROL: 229 mg/dL — AB (ref 125–200)
HDL: 68 mg/dL (ref >=46)
LDL Cholesterol: 106 mg/dL (ref <130)
TRIGLYCERIDES: 275 mg/dL — AB (ref <150)
Total CHOL/HDL Ratio: 3.4 Ratio (ref <=5.0)
VLDL: 55 mg/dL — ABNORMAL HIGH (ref <30)

## 2015-06-29 LAB — MAGNESIUM: Magnesium: 2.2 mg/dL (ref 1.5–2.5)

## 2015-06-29 MED ORDER — ESCITALOPRAM OXALATE 10 MG PO TABS: 10.0000 mg | ORAL_TABLET | Freq: Every day | ORAL | Status: DC

## 2015-06-29 MED ORDER — LEVOTHYROXINE SODIUM 50 MCG PO TABS: 50.0000 ug | ORAL_TABLET | Freq: Every day | ORAL | Status: DC

## 2015-06-29 NOTE — Patient Instructions (Addendum)
Pulmonary Nodule A pulmonary nodule is a small, round growth of tissue in the lung. Pulmonary nodules can range in size from less than 1/5 inch (4 mm) to a little bigger than an inch (25 mm). Most pulmonary nodules are detected when imaging tests of the lung are being performed for a different problem. Pulmonary nodules are usually not cancerous (benign). However, some pulmonary nodules are cancerous (malignant). Follow-up treatment or testing is based on the size of the pulmonary nodule and your risk of getting lung cancer.  CAUSES Benign pulmonary nodules can be caused by various things. Some of the causes include:   Bacterial, fungal, or viral infections. This is usually an old infection that is no longer active, but it can sometimes be a current, active infection.  A benign mass of tissue.  Inflammation from conditions such as rheumatoid arthritis.   Abnormal blood vessels in the lungs. Malignant pulmonary nodules can result from lung cancer or from cancers that spread to the lung from other places in the body. SIGNS AND SYMPTOMS Pulmonary nodules usually do not cause symptoms. DIAGNOSIS Most often, pulmonary nodules are found incidentally when an X-ray or CT scan is performed to look for some other problem in the lung area. To help determine whether a pulmonary nodule is benign or malignant, your health care provider will take a medical history and order a variety of tests. Tests done may include:   Blood tests.  A skin test called a tuberculin test. This test is used to determine if you have been exposed to the germ that causes tuberculosis.   Chest X-rays. If possible, a new X-ray may be compared with X-rays you have had in the past.   CT scan. This test shows smaller pulmonary nodules more clearly than an X-ray.   Positron emission tomography (PET) scan. In this test, a safe amount of a radioactive substance is injected into the bloodstream. Then, the scan takes a picture of  the pulmonary nodule. The radioactive substance is eliminated from your body in your urine.   Biopsy. A tiny piece of the pulmonary nodule is removed so it can be checked under a microscope. TREATMENT  Pulmonary nodules that are benign normally do not require any treatment because they usually do not cause symptoms or breathing problems. Your health care provider may want to monitor the pulmonary nodule through follow-up CT scans. The frequency of these CT scans will vary based on the size of the nodule and the risk factors for lung cancer. For example, CT scans will need to be done more frequently if the pulmonary nodule is larger and if you have a history of smoking and a family history of cancer. Further testing or biopsies may be done if any follow-up CT scan shows that the size of the pulmonary nodule has increased. HOME CARE INSTRUCTIONS  Only take over-the-counter or prescription medicines as directed by your health care provider.  Keep all follow-up appointments with your health care provider. SEEK MEDICAL CARE IF:  You have trouble breathing when you are active.   You feel sick or unusually tired.   You do not feel like eating.   You lose weight without trying to.   You develop chills or night sweats.  SEEK IMMEDIATE MEDICAL CARE IF:  You cannot catch your breath, or you begin wheezing.   You cannot stop coughing.   You cough up blood.   You become dizzy or feel like you are going to pass out.   You  have sudden chest pain.   You have a fever or persistent symptoms for more than 2-3 days.   You have a fever and your symptoms suddenly get worse. MAKE SURE YOU:  Understand these instructions.  Will watch your condition.  Will get help right away if you are not doing well or get worse.   This information is not intended to replace advice given to you by your health care provider. Make sure you discuss any questions you have with your health care  provider.   Document Released: 03/31/2009 Document Revised: 02/03/2013 Document Reviewed: 11/23/2012 Elsevier Interactive Patient Education 2016 [place] you to get the 3D Mammogram  The 3D Mammogram is much more specific and sensitive to pick up breast cancer. For women with fibrocystic breast or lumpy breast it can be hard to determine if it is cancer or not but the 3D mammogram is able to tell this difference which cuts back on unneeded additional tests or scary call backs.   - over 40% increase in detection of breast cancer - over 40% reduction in false positives.  - fewer call backs - reduced anxiety - improved outcomes - [facility]  7 a.m.-6:30 p.m., Monday 7 a.m.-5 p.m., Tuesday-Friday Schedule an appointment by calling [phone].  Add ENTERIC COATED low dose 81 mg Aspirin daily OR can do every other day if you have easy bruising to protect your heart and head. As well as to reduce risk of Colon Cancer by 20 %, Skin Cancer by 26 % , Melanoma by 46% and Pancreatic cancer by 60%  Preventive Care for Adults A healthy lifestyle and preventive care can promote health and wellness. Preventive health guidelines for women include the following key practices.  A routine yearly physical is a good way to check with your health care provider about your health and preventive screening. It is a chance to share any concerns and updates on your health and to receive a thorough exam.  Visit your dentist for a routine exam and preventive care every 6 months. Brush your teeth twice a day and floss once a day. Good oral hygiene prevents tooth decay and gum disease.  The frequency of eye exams is based on your age, health, family medical history, use of contact lenses, and other factors. Follow your health care provider's recommendations for frequency of eye exams.  Eat a healthy diet. Foods like vegetables, fruits, whole grains,  low-fat dairy products, and lean protein foods contain the nutrients you need without too many calories. Decrease your intake of foods high in solid fats, added sugars, and salt. Eat the right amount of calories for you.Get information about a proper diet from your health care provider, if necessary.  Regular physical exercise is one of the most important things you can do for your health. Most adults should get at least 150 minutes of moderate-intensity exercise (any activity that increases your heart rate and causes you to sweat) each week. In addition, most adults need muscle-strengthening exercises on 2 or more days a week.  Maintain a healthy weight. The body mass index (BMI) is a screening tool to identify possible weight problems. It provides an estimate of body fat based on height and weight. Your health care provider can find your BMI and can help you achieve or maintain a healthy weight.For adults 20 years and older:  A BMI below 18.5 is considered underweight.  A BMI of 18.5  to 24.9 is normal.  A BMI of 25 to 29.9 is considered overweight.  A BMI of 30 and above is considered obese.  Maintain normal blood lipids and cholesterol levels by exercising and minimizing your intake of saturated fat. Eat a balanced diet with plenty of fruit and vegetables. If your lipid or cholesterol levels are high, you are over 50, or you are at high risk for heart disease, you may need your cholesterol levels checked more frequently.Ongoing high lipid and cholesterol levels should be treated with medicines if diet and exercise are not working.  If you smoke, find out from your health care provider how to quit. If you do not use tobacco, do not start.  Lung cancer screening is recommended for adults aged 47-80 years who are at high risk for developing lung cancer because of a history of smoking. A yearly low-dose CT scan of the lungs is recommended for people who have at least a 30-pack-year history of  smoking and are a current smoker or have quit within the past 15 years. A pack year of smoking is smoking an average of 1 pack of cigarettes a day for 1 year (for example: 1 pack a day for 30 years or 2 packs a day for 15 years). Yearly screening should continue until the smoker has stopped smoking for at least 15 years. Yearly screening should be stopped for people who develop a health problem that would prevent them from having lung cancer treatment.  Avoid use of street drugs. Do not share needles with anyone. Ask for help if you need support or instructions about stopping the use of drugs.  High blood pressure causes heart disease and increases the risk of stroke.  Ongoing high blood pressure should be treated with medicines if weight loss and exercise do not work.  If you are 40-32 years old, ask your health care provider if you should take aspirin to prevent strokes.  Diabetes screening involves taking a blood sample to check your fasting blood sugar level. This should be done once every 3 years, after age 48, if you are within normal weight and without risk factors for diabetes. Testing should be considered at a younger age or be carried out more frequently if you are overweight and have at least 1 risk factor for diabetes.  Breast cancer screening is essential preventive care for women. You should practice "breast self-awareness." This means understanding the normal appearance and feel of your breasts and may include breast self-examination. Any changes detected, no matter how small, should be reported to a health care provider. Women in their 50s and 30s should have a clinical breast exam (CBE) by a health care provider as part of a regular health exam every 1 to 3 years. After age 57, women should have a CBE every year. Starting at age 16, women should consider having a mammogram (breast X-ray test) every year. Women who have a family history of breast cancer should talk to their health care  provider about genetic screening. Women at a high risk of breast cancer should talk to their health care providers about having an MRI and a mammogram every year.  Breast cancer gene (BRCA)-related cancer risk assessment is recommended for women who have family members with BRCA-related cancers. BRCA-related cancers include breast, ovarian, tubal, and peritoneal cancers. Having family members with these cancers may be associated with an increased risk for harmful changes (mutations) in the breast cancer genes BRCA1 and BRCA2. Results of the assessment will  determine the need for genetic counseling and BRCA1 and BRCA2 testing.  Routine pelvic exams to screen for cancer are no longer recommended for nonpregnant women who are considered low risk for cancer of the pelvic organs (ovaries, uterus, and vagina) and who do not have symptoms. Ask your health care provider if a screening pelvic exam is right for you.  If you have had past treatment for cervical cancer or a condition that could lead to cancer, you need Pap tests and screening for cancer for at least 20 years after your treatment. If Pap tests have been discontinued, your risk factors (such as having a new sexual partner) need to be reassessed to determine if screening should be resumed. Some women have medical problems that increase the chance of getting cervical cancer. In these cases, your health care provider may recommend more frequent screening and Pap tests.    Colorectal cancer can be detected and often prevented. Most routine colorectal cancer screening begins at the age of 40 years and continues through age 58 years. However, your health care provider may recommend screening at an earlier age if you have risk factors for colon cancer. On a yearly basis, your health care provider may provide home test kits to check for hidden blood in the stool. Use of a small camera at the end of a tube, to directly examine the colon (sigmoidoscopy or  colonoscopy), can detect the earliest forms of colorectal cancer. Talk to your health care provider about this at age 70, when routine screening begins. Direct exam of the colon should be repeated every 5-10 years through age 89 years, unless early forms of pre-cancerous polyps or small growths are found.  Osteoporosis is a disease in which the bones lose minerals and strength with aging. This can result in serious bone fractures or breaks. The risk of osteoporosis can be identified using a bone density scan. Women ages 41 years and over and women at risk for fractures or osteoporosis should discuss screening with their health care providers. Ask your health care provider whether you should take a calcium supplement or vitamin D to reduce the rate of osteoporosis.  Menopause can be associated with physical symptoms and risks. Hormone replacement therapy is available to decrease symptoms and risks. You should talk to your health care provider about whether hormone replacement therapy is right for you.  Use sunscreen. Apply sunscreen liberally and repeatedly throughout the day. You should seek shade when your shadow is shorter than you. Protect yourself by wearing long sleeves, pants, a wide-brimmed hat, and sunglasses year round, whenever you are outdoors.  Once a month, do a whole body skin exam, using a mirror to look at the skin on your back. Tell your health care provider of new moles, moles that have irregular borders, moles that are larger than a pencil eraser, or moles that have changed in shape or color.  Stay current with required vaccines (immunizations).  Influenza vaccine. All adults should be immunized every year.  Tetanus, diphtheria, and acellular pertussis (Td, Tdap) vaccine. Pregnant women should receive 1 dose of Tdap vaccine during each pregnancy. The dose should be obtained regardless of the length of time since the last dose. Immunization is preferred during the 27th-36th week of  gestation. An adult who has not previously received Tdap or who does not know her vaccine status should receive 1 dose of Tdap. This initial dose should be followed by tetanus and diphtheria toxoids (Td) booster doses every 10 years. Adults with  an unknown or incomplete history of completing a 3-dose immunization series with Td-containing vaccines should begin or complete a primary immunization series including a Tdap dose. Adults should receive a Td booster every 10 years.    Zoster vaccine. One dose is recommended for adults aged 20 years or older unless certain conditions are present.    Pneumococcal 13-valent conjugate (PCV13) vaccine. When indicated, a person who is uncertain of her immunization history and has no record of immunization should receive the PCV13 vaccine. An adult aged 63 years or older who has certain medical conditions and has not been previously immunized should receive 1 dose of PCV13 vaccine. This PCV13 should be followed with a dose of pneumococcal polysaccharide (PPSV23) vaccine. The PPSV23 vaccine dose should be obtained at least 8 weeks after the dose of PCV13 vaccine. An adult aged 67 years or older who has certain medical conditions and previously received 1 or more doses of PPSV23 vaccine should receive 1 dose of PCV13. The PCV13 vaccine dose should be obtained 1 or more years after the last PPSV23 vaccine dose.    Pneumococcal polysaccharide (PPSV23) vaccine. When PCV13 is also indicated, PCV13 should be obtained first. All adults aged 8 years and older should be immunized. An adult younger than age 61 years who has certain medical conditions should be immunized. Any person who resides in a nursing home or long-term care facility should be immunized. An adult smoker should be immunized. People with an immunocompromised condition and certain other conditions should receive both PCV13 and PPSV23 vaccines. People with human immunodeficiency virus (HIV) infection should be  immunized as soon as possible after diagnosis. Immunization during chemotherapy or radiation therapy should be avoided. Routine use of PPSV23 vaccine is not recommended for American Indians, [address], or people younger than 65 years unless there are medical conditions that require PPSV23 vaccine. When indicated, people who have unknown immunization and have no record of immunization should receive PPSV23 vaccine. One-time revaccination 5 years after the first dose of PPSV23 is recommended for people aged 19-64 years who have chronic kidney failure, nephrotic syndrome, asplenia, or immunocompromised conditions. People who received 1-2 doses of PPSV23 before age 68 years should receive another dose of PPSV23 vaccine at age 31 years or later if at least 5 years have passed since the previous dose. Doses of PPSV23 are not needed for people immunized with PPSV23 at or after age 40 years.   Preventive Services / Frequency  Ages 65 years and over  Blood pressure check.  Lipid and cholesterol check.  Lung cancer screening. / Every year if you are aged 55-80 years and have a 30-pack-year history of smoking and currently smoke or have quit within the past 15 years. Yearly screening is stopped once you have quit smoking for at least 15 years or develop a health problem that would prevent you from having lung cancer treatment.  Clinical breast exam.** / Every year after age 60 years.  BRCA-related cancer risk assessment.** / For women who have family members with a BRCA-related cancer (breast, ovarian, tubal, or peritoneal cancers).  Mammogram.** / Every year beginning at age 92 years and continuing for as long as you are in good health. Consult with your health care provider.  Pap test.** / Every 3 years starting at age 74 years through age 11 or 22 years with 3 consecutive normal Pap tests. Testing can be stopped between 65 and 70 years with 3 consecutive normal Pap tests and no abnormal Pap or  HPV  tests in the past 10 years.  Fecal occult blood test (FOBT) of stool. / Every year beginning at age 85 years and continuing until age 64 years. You may not need to do this test if you get a colonoscopy every 10 years.  Flexible sigmoidoscopy or colonoscopy.** / Every 5 years for a flexible sigmoidoscopy or every 10 years for a colonoscopy beginning at age 60 years and continuing until age 17 years.  Hepatitis C blood test.** / For all people born from [birth year] through 1965 and any individual with known risks for hepatitis C.  Osteoporosis screening.** / A one-time screening for women ages 18 years and over and women at risk for fractures or osteoporosis.  Skin self-exam. / Monthly.  Influenza vaccine. / Every year.  Tetanus, diphtheria, and acellular pertussis (Tdap/Td) vaccine.** / 1 dose of Td every 10 years.  Zoster vaccine.** / 1 dose for adults aged 40 years or older.  Pneumococcal 13-valent conjugate (PCV13) vaccine.** / Consult your health care provider.  Pneumococcal polysaccharide (PPSV23) vaccine.** / 1 dose for all adults aged 78 years and older. Screening for abdominal aortic aneurysm (AAA)  by ultrasound is recommended for people who have history of high blood pressure or who are current or former smokers.

## 2015-06-29 NOTE — Progress Notes (Signed)
MEDICARE ANNUAL WELLNESS VISIT AND FOLLOW UP  Assessment:   1. Other abnormal glucose Discussed general issues about diabetes pathophysiology and management., Educational material distributed., Suggested low cholesterol diet., Encouraged aerobic exercise., Discussed foot care., Reminded to get yearly retinal exam.  2. Mixed hyperlipidemia -continue medications, check lipids, decrease fatty foods, increase activity.  - Lipid panel  3. Elevated BP - CBC with Differential/Platelet - Hepatic function panel - Urinalysis, Routine w reflex microscopic (not at [facility]) - Microalbumin / creatinine urine ratio - EKG 12-Lead  4. Hypothyroidism, unspecified hypothyroidism type - levothyroxine (SYNTHROID, LEVOTHROID) 50 MCG tablet; Take 1 tablet (50 mcg total) by mouth daily before breakfast.  Dispense: 90 tablet; Refill: 4  5. Anemia, unspecified anemia type - Ferritin - Iron and TIBC  6. Anxiety Doing much better, follow up 6 months - escitalopram (LEXAPRO) 10 MG tablet; Take 1 tablet (10 mg total) by mouth daily.  Dispense: 90 tablet; Refill: 4  7. Medication management - Magnesium  8. Vitamin D deficiency disease  9. Pulmonary nodules Declines CT scan due to radiation, only smoked 5 years, no symptoms at this time, long discussion and declines, will continue to discuss   Plan:   During the course of the visit the patient was educated and counseled about appropriate screening and preventive services including:    Pneumococcal vaccine   Influenza vaccine  Td vaccine  Screening electrocardiogram  Screening mammography  Bone densitometry screening  Colorectal cancer screening  Diabetes screening  Glaucoma screening  Nutrition counseling   Advanced directives: given info/requested  Conditions/risks identified: BMI: Discussed weight loss, diet, and increase physical activity.  Increase physical activity: AHA recommends 150 minutes of physical activity a week.   Medications reviewed Diabetes is at goal, ACE/ARB therapy: No, Reason not on Ace Inhibitor/ARB therapy:  only predm Urinary Incontinence is not an issue: discussed non pharmacology and pharmacology options.  Fall risk: low- discussed PT, home fall assessment, medications.    Subjective:   Debra Hayes is a 70 y.o. female who presents for Medicare Annual Wellness Visit and 3 month follow up on hypertension, prediabetes, hyperlipidemia, vitamin D def.  Date of last medicare wellness visit was 05/2014  Her blood pressure has been controlled at home, today their BP is BP: 100/70 mmHg She does workout. She denies chest pain, shortness of breath, dizziness.  She is not on cholesterol medication and denies myalgias. Her cholesterol is at goal. The cholesterol last visit was:  Lab Results  Component Value Date   CHOL 216* 03/20/2015   HDL 66 03/20/2015   LDLCALC 96 03/20/2015   TRIG 270* 03/20/2015   CHOLHDL 3.3 03/20/2015     She has been working on diet and exercise for prediabetes, and denies paresthesia of the feet, polydipsia and polyuria. Her GFR was 75 with her BUN being elevated at 23. She had it recently checked with Dr. Chalmers Cater 06/2015 was better at 5.8, Last A1C in the office was:  Lab Results  Component Value Date   HGBA1C 6.0* 03/20/2015  Patient is on Vitamin D supplement, she is on 1000 every day.  Lab Results  Component Value Date   VD25OH 29* 03/20/2015   She is on thyroid medication, she has been seeing Dr. Chalmers Cater for her TSH, checked recently in Jan, was normal. Her medication was not changed last visit was Lab Results  Component Value Date   TSH 1.896 03/20/2015   She has anxiety/depression, started on lexapro and has done remarkable well, she is also  seeing a Social worker.   Names of Other Physician/Practitioners you currently use: 1. [facility]- here for primary care 2. Dr. Trenton Gammon, dentist 3. Dr. Chalmers Cater 4. Dr. Sabra Heck, has  appt Dec Patient Care Team: Unk Pinto, MD as PCP - General (Internal Medicine) Rozetta Nunnery, MD as Consulting Physician (Otolaryngology) Melissa Noon, OD as Referring Physician (Optometry) Rolm Bookbinder, MD as Consulting Physician (Dermatology) Irene Shipper, MD as Consulting Physician (Gastroenterology) Jacelyn Pi, MD as Consulting Physician (Endocrinology)  Medication Review Current Outpatient Prescriptions on File Prior to Visit  Medication Sig Dispense Refill  . Vitamin D, Ergocalciferol, (DRISDOL) 50000 UNITS CAPS capsule Take 1 capsule (50,000 Units total) by mouth every 7 (seven) days. 30 capsule 1   No current facility-administered medications on file prior to visit.    Current Problems (verified) Patient Active Problem List   Diagnosis Date Noted  . Elevated BP 03/20/2015  . Mixed hyperlipidemia 03/20/2015  . Other abnormal glucose 03/20/2015  . Vitamin D deficiency disease 03/20/2015  . Pulmonary nodules 08/23/2014  . Medication management 05/17/2014  . Anemia   . Anxiety   . Depression, controlled   . Hypothyroidism   . Palpitation 11/20/2012    Screening Tests Health Maintenance  Topic Date Due  . Hepatitis C Screening  28-Jul-1945  . ZOSTAVAX  08/04/2019 (Originally 05/14/2006)  . PNA vac Low Risk Adult (1 of 2 - PCV13) 08/04/2019 (Originally 05/15/2011)  . INFLUENZA VACCINE  07/25/2020 (Originally 01/16/2015)  . TETANUS/TDAP  06/17/2016  . MAMMOGRAM  07/15/2016  . COLONOSCOPY  10/09/2022  . DEXA SCAN  Completed     Immunization History  Administered Date(s) Administered  . Tdap 06/17/2006   Preventative care: Last colonoscopy: 2014 EGD 2014 normal Last mammogram: 06/2014. CAT D, right simple cyst Last pap smear/pelvic exam: 2011 normal, declines LMP age 69  DEXA: 06/2014 normal MRI head normal 2004 Thyroid US 2014 unchanged Stress test 11/2012  Prior vaccinations: TD or Tdap: 2008  Influenza: 2008, declines Pneumococcal:  declines Prevnar13:  declines Shingles/Zostavax: declines  Allergies No Known Allergies Surgical history Past Surgical History  Procedure Laterality Date  . Thyroid cyst excision    . Tonsillectomy and adenoidectomy     Family history Family History  Problem Relation Age of Onset  . Heart disease Father 50    2 heart attacks  . Bladder Cancer Mother   . Thyroid cancer Father    Tobacco Social History  Substance Use Topics  . Smoking status: Former Smoker    Types: Cigarettes    Quit date: 06/17/1968  . Smokeless tobacco: Never Used     Comment: Smoked 40 years ago.  . Alcohol Use: 0.0 oz/week    0 Standard drinks or equivalent per week     Comment: very little   MEDICARE WELLNESS OBJECTIVES: Tobacco use: She does not smoke.  Patient is a former smoker, smoked for 5 years. If yes, counseling given Alcohol Current alcohol use: none Osteoporosis: postmenopausal estrogen deficiency and dietary calcium and/or vitamin D deficiency, History of fracture in the past year: no Fall risk: Low Risk Diet: in general, a "healthy" diet   Physical activity: Current Exercise Habits:: Structured exercise class, Type of exercise: Other - see comments (zumba), Time (Minutes): 30, Frequency (Times/Week): 2, Weekly Exercise (Minutes/Week): 60, Intensity: Moderate Cardiac risk factors: Cardiac Risk Factors include: hypertension;dyslipidemia Depression/mood screen:   Depression screen [facility] 2/9 06/29/2015  Decreased Interest 0  Down, Depressed, Hopeless 0  PHQ - 2 Score  0  Altered sleeping -  Tired, decreased energy -  Change in appetite -  Feeling bad or failure about yourself  -  Trouble concentrating -  Moving slowly or fidgety/restless -  Suicidal thoughts -  PHQ-9 Score -    ADLs:  In your present state of health, do you have any difficulty performing the following activities: 06/29/2015  Hearing? N  Vision? N  Difficulty concentrating or making decisions? N  Walking or climbing  stairs? N  Dressing or bathing? N  Doing errands, shopping? N  Preparing Food and eating ? N  Using the Toilet? N  In the past six months, have you accidently leaked urine? N  Do you have problems with loss of bowel control? N  Managing your Medications? N  Managing your Finances? N  Housekeeping or managing your Housekeeping? N     Cognitive Testing  Alert? Yes  Normal Appearance?Yes  Oriented to person? Yes  Place? Yes   Time? Yes  Recall of three objects?  Yes  Can perform simple calculations? Yes  Displays appropriate judgment?Yes  Can read the correct time from a watch face?Yes  EOL planning: Does patient have an advance directive?: Yes Type of Advance Directive: [place], Living will Does patient want to make changes to advanced directive?: No - Patient declined Copy of advanced directive(s) in chart?: No - copy requested   Objective:   Blood pressure 100/70, pulse 64, temperature 98.1 F (36.7 C), temperature source Temporal, height 5' 9.5" (1.765 m), weight 138 lb (62.596 kg), SpO2 95 %. Body mass index is 20.09 kg/(m^2).  General appearance: alert, no distress, WD/WN,  female HEENT: normocephalic, sclerae anicteric, TMs pearly, nares patent, no discharge or erythema, pharynx normal Oral cavity: MMM, no lesions Neck: supple, no lymphadenopathy, no thyromegaly, no masses Heart: RRR, normal S1, S2, no murmurs Lungs: CTA bilaterally, no wheezes, rhonchi, or rales Abdomen: +bs, soft, non tender, non distended, no masses, no hepatomegaly, no splenomegaly Musculoskeletal: nontender, no swelling, no obvious deformity Extremities: no edema, no cyanosis, no clubbing Pulses: 2+ symmetric, upper and lower extremities, normal cap refill Neurological: alert, oriented x 3, CN2-12 intact, strength normal upper extremities and lower extremities, sensation normal throughout, DTRs 2+ throughout, no cerebellar signs, gait normal Psychiatric: normal affect,  behavior normal, pleasant  Breast: defer Gyn: defer Rectal: defer  Medicare Attestation I have personally reviewed: The patient's medical and social history Their use of alcohol, tobacco or illicit drugs Their current medications and supplements The patient's functional ability including ADLs,fall risks, home safety risks, cognitive, and hearing and visual impairment Diet and physical activities Evidence for depression or mood disorders  The patient's weight, height, BMI, and visual acuity have been recorded in the chart.  I have made referrals, counseling, and provided education to the patient based on review of the above and I have provided the patient with a written personalized care plan for preventive services.     Vicie Mutters, PA-C   06/29/2015

## 2015-06-30 LAB — MICROALBUMIN / CREATININE URINE RATIO
Creatinine, Urine: 88 mg/dL (ref 20–320)
Microalb, Ur: <0.2 mg/dL

## 2015-06-30 LAB — URINALYSIS, ROUTINE W REFLEX MICROSCOPIC
Bilirubin Urine: NEGATIVE
Glucose, UA: NEGATIVE
HGB URINE DIPSTICK: NEGATIVE
KETONES UR: NEGATIVE
Leukocytes, UA: NEGATIVE
Nitrite: NEGATIVE
PH: 5 (ref 5.0–8.0)
Protein, ur: NEGATIVE
Specific Gravity, Urine: 1.019 (ref 1.001–1.035)

## 2015-06-30 LAB — FERRITIN: Ferritin: 67 ng/mL (ref 10–291)

## 2015-08-09 ENCOUNTER — Other Ambulatory Visit: Payer: Self-pay | Admitting: Internal Medicine

## 2015-08-18 DIAGNOSIS — Z419 Encounter for procedure for purposes other than remedying health state, unspecified: Secondary | ICD-10-CM | POA: Diagnosis not present

## 2015-08-18 DIAGNOSIS — L570 Actinic keratosis: Secondary | ICD-10-CM | POA: Diagnosis not present

## 2015-08-18 DIAGNOSIS — L438 Other lichen planus: Secondary | ICD-10-CM | POA: Diagnosis not present

## 2015-08-21 ENCOUNTER — Other Ambulatory Visit: Payer: Self-pay

## 2015-08-21 DIAGNOSIS — Z1231 Encounter for screening mammogram for malignant neoplasm of breast: Secondary | ICD-10-CM

## 2015-09-04 ENCOUNTER — Ambulatory Visit: Payer: Medicare Other

## 2015-11-08 ENCOUNTER — Other Ambulatory Visit: Payer: Self-pay

## 2015-11-08 DIAGNOSIS — E039 Hypothyroidism, unspecified: Secondary | ICD-10-CM

## 2015-11-08 MED ORDER — ESCITALOPRAM OXALATE 10 MG PO TABS: 10.0000 mg | ORAL_TABLET | Freq: Every day | ORAL | Status: DC

## 2015-11-08 MED ORDER — LEVOTHYROXINE SODIUM 50 MCG PO TABS: 50.0000 ug | ORAL_TABLET | Freq: Every day | ORAL | Status: DC

## 2015-11-17 ENCOUNTER — Telehealth: Payer: Self-pay | Admitting: *Deleted

## 2015-11-17 ENCOUNTER — Telehealth: Payer: Self-pay | Admitting: Cardiology

## 2015-11-17 NOTE — Telephone Encounter (Signed)
-----   Message from Minus Breeding, MD sent at 11/17/2015  1:06 PM EDT ----- Please call this patient.  She was supposed to have a follow up CT.  She and I discussed this last year.  ----- Message -----    From: Minus Breeding, MD    Sent: 08/19/2014   2:05 PM      To: Minus Breeding, MD  Call about CT.

## 2015-11-17 NOTE — Telephone Encounter (Signed)
The patient is to be called for a follow up CT.

## 2015-12-01 NOTE — Telephone Encounter (Signed)
Leave message for pt to call back 

## 2015-12-07 NOTE — Telephone Encounter (Signed)
Leave message for pt to call back 

## 2016-01-03 ENCOUNTER — Encounter: Payer: Self-pay | Admitting: Physician Assistant

## 2016-01-03 ENCOUNTER — Ambulatory Visit (INDEPENDENT_AMBULATORY_CARE_PROVIDER_SITE_OTHER): Payer: Medicare Other | Admitting: Physician Assistant

## 2016-01-03 VITALS — BP 118/60 | HR 86 | Temp 97.0°F | Resp 16 | Ht 69.5 in | Wt 140.2 lb

## 2016-01-03 DIAGNOSIS — R7309 Other abnormal glucose: Secondary | ICD-10-CM | POA: Diagnosis not present

## 2016-01-03 DIAGNOSIS — E782 Mixed hyperlipidemia: Secondary | ICD-10-CM | POA: Diagnosis not present

## 2016-01-03 DIAGNOSIS — Z79899 Other long term (current) drug therapy: Secondary | ICD-10-CM

## 2016-01-03 DIAGNOSIS — R030 Elevated blood-pressure reading, without diagnosis of hypertension: Secondary | ICD-10-CM

## 2016-01-03 DIAGNOSIS — F325 Major depressive disorder, single episode, in full remission: Secondary | ICD-10-CM | POA: Diagnosis not present

## 2016-01-03 DIAGNOSIS — E559 Vitamin D deficiency, unspecified: Secondary | ICD-10-CM

## 2016-01-03 DIAGNOSIS — IMO0001 Reserved for inherently not codable concepts without codable children: Secondary | ICD-10-CM

## 2016-01-03 DIAGNOSIS — E039 Hypothyroidism, unspecified: Secondary | ICD-10-CM

## 2016-01-03 LAB — CBC WITH DIFFERENTIAL/PLATELET
Basophils Absolute: 0 cells/uL (ref 0–200)
Basophils Relative: 0 %
Eosinophils Absolute: 78 cells/uL (ref 15–500)
Eosinophils Relative: 1 %
HEMATOCRIT: 44.3 % (ref 35.0–45.0)
HEMOGLOBIN: 14.8 g/dL (ref 11.7–15.5)
Lymphocytes Relative: 28 %
Lymphs Abs: 2184 cells/uL (ref 850–3900)
MCH: 30 pg (ref 27.0–33.0)
MCHC: 33.4 g/dL (ref 32.0–36.0)
MCV: 89.7 fL (ref 80.0–100.0)
MONO ABS: 390 {cells}/uL (ref 200–950)
MPV: 11 fL (ref 7.5–12.5)
Monocytes Relative: 5 %
NEUTROS ABS: 5148 {cells}/uL (ref 1500–7800)
NEUTROS PCT: 66 %
Platelets: 262 10*3/uL (ref 140–400)
RBC: 4.94 MIL/uL (ref 3.80–5.10)
RDW: 13.9 % (ref 11.0–15.0)
WBC: 7.8 10*3/uL (ref 3.8–10.8)

## 2016-01-03 LAB — TSH: TSH: 1.4 m[IU]/L

## 2016-01-03 LAB — HEMOGLOBIN A1C
Hgb A1c MFr Bld: 5.7 % — ABNORMAL HIGH (ref <5.7)
MEAN PLASMA GLUCOSE: 117 mg/dL

## 2016-01-03 NOTE — Progress Notes (Signed)
Assessment and Plan: Anxiety continue medications, stress management techniques discussed, increase water, good sleep hygiene discussed, increase exercise, and increase veggies.   lexapro 10mg   Hypothyroidism, unspecified hypothyroidism type Hypothyroidism-check TSH level, continue medications the same, reminded to take on an empty stomach 30-59mins before food.  - TSH  Prediabetes Discussed general issues about diabetes pathophysiology and management., Educational material distributed., Suggested low cholesterol diet., Encouraged aerobic exercise., Discussed foot care., Reminded to get yearly retinal exam. - CBC with Differential/Platelet - BASIC METABOLIC PANEL WITH GFR - Hepatic function panel - Hemoglobin A1c  Vitamin D deficiency  Medication management - Magnesium  Future Appointments Date Time Provider [place]  06/25/2016 2:00 PM Vicie Mutters, PA-C [facility] None     HPI 70 y.o.female presents for follow up for anxiety, thyroid, vitamin D def.  She is currently on lexapro 10mg  and doing very well with this medication and dose. She has started to volunteer at the [employer], [place] and going to [employer]. She is  Doing focused meditation, or going to start with church group.  Her blood pressure has been controlled at home, today their BP is BP: 118/60 mmHg  She does workout. She denies chest pain, shortness of breath, dizziness.  She is not on cholesterol medication and denies myalgias. Her cholesterol is at goal. The cholesterol last visit was:   Lab Results  Component Value Date   CHOL 229* 06/29/2015   HDL 68 06/29/2015   LDLCALC 106 06/29/2015   TRIG 275* 06/29/2015   CHOLHDL 3.4 06/29/2015   She has been working on diet and exercise for prediabetes, and denies paresthesia of the feet, polydipsia, polyuria and visual disturbances. Last A1C in the office was:  Lab Results  Component Value Date   HGBA1C 6.0* 03/20/2015  She is on vitamin D for  def, 50,000 once a week.  Lab Results  Component Value Date   VD25OH 29* 03/20/2015  She is on thyroid medication. Her medication was not changed last visit.  Lab Results  Component Value Date   TSH 1.896 03/20/2015      Past Medical History  Diagnosis Date  . At high risk for altered glucose metabolism   . RLS (restless legs syndrome)   . Anemia   . Anxiety   . Depression   . Hypothyroidism   . Cancer ([place])      No Known Allergies    Current Outpatient Prescriptions on File Prior to Visit  Medication Sig Dispense Refill  . escitalopram (LEXAPRO) 10 MG tablet Take 1 tablet (10 mg total) by mouth daily. 90 tablet 1  . levothyroxine (SYNTHROID, LEVOTHROID) 50 MCG tablet Take 1 tablet (50 mcg total) by mouth daily before breakfast. 90 tablet 1  . Vitamin D, Ergocalciferol, (DRISDOL) 50000 UNITS CAPS capsule Take 1 capsule (50,000 Units total) by mouth every 7 (seven) days. 30 capsule 1   No current facility-administered medications on file prior to visit.    ROS: all negative except above.   Physical Exam: BP 118/60 mmHg  Pulse 86  Temp(Src) 97 F (36.1 C) (Temporal)  Resp 16  Ht 5' 9.5" (1.765 m)  Wt 140 lb 3.2 oz (63.594 kg)  BMI 20.41 kg/m2  SpO2 96%  Wt Readings from Last 3 Encounters:  01/03/16 140 lb 3.2 oz (63.594 kg)  06/29/15 138 lb (62.596 kg)  03/20/15 132 lb 3.2 oz (59.966 kg)   General Appearance: Well nourished, in no apparent distress. Eyes: PERRLA, EOMs, conjunctiva no swelling or erythema  Sinuses: No Frontal/maxillary tenderness ENT/Mouth: Ext aud canals clear, TMs without erythema, bulging. No erythema, swelling, or exudate on post pharynx.  Tonsils not swollen or erythematous. Hearing normal.  Neck: Supple, thyroid normal.  Respiratory: Respiratory effort normal, BS equal bilaterally without rales, rhonchi, wheezing or stridor.  Cardio: RRR with no MRGs. Brisk peripheral pulses without edema.  Abdomen: Soft, + BS.  Non tender, no guarding,  rebound, hernias, masses. Lymphatics: Non tender without lymphadenopathy.  Musculoskeletal: Full ROM, 5/5 strength, normal gait.  Skin: Warm, dry without rashes, lesions, ecchymosis.  Neuro: Cranial nerves intact. Normal muscle tone, no cerebellar symptoms. Sensation intact.  Psych: Awake and oriented X 3, normal affect, Insight and Judgment appropriate.     Vicie Mutters, PA-C 2:33 PM [facility]

## 2016-01-04 LAB — BASIC METABOLIC PANEL WITH GFR
BUN: 19 mg/dL (ref 7–25)
CALCIUM: 9.4 mg/dL (ref 8.6–10.4)
CO2: 26 mmol/L (ref 20–31)
Chloride: 105 mmol/L (ref 98–110)
Creat: 1.06 mg/dL — ABNORMAL HIGH (ref 0.50–0.99)
GFR, EST AFRICAN AMERICAN: 62 mL/min (ref >=60)
GFR, Est Non African American: 54 mL/min — ABNORMAL LOW (ref >=60)
GLUCOSE: 100 mg/dL — AB (ref 65–99)
POTASSIUM: 4.4 mmol/L (ref 3.5–5.3)
Sodium: 140 mmol/L (ref 135–146)

## 2016-01-04 LAB — HEPATIC FUNCTION PANEL
ALT: 17 U/L (ref 6–29)
AST: 23 U/L (ref 10–35)
Albumin: 4.6 g/dL (ref 3.6–5.1)
Alkaline Phosphatase: 63 U/L (ref 33–130)
BILIRUBIN DIRECT: 0.1 mg/dL (ref <=0.2)
BILIRUBIN INDIRECT: 0.5 mg/dL (ref 0.2–1.2)
BILIRUBIN TOTAL: 0.6 mg/dL (ref 0.2–1.2)
Total Protein: 7.2 g/dL (ref 6.1–8.1)

## 2016-01-04 LAB — LIPID PANEL
CHOLESTEROL: 228 mg/dL — AB (ref 125–200)
HDL: 63 mg/dL (ref >=46)
LDL Cholesterol: 108 mg/dL (ref <130)
TRIGLYCERIDES: 283 mg/dL — AB (ref <150)
Total CHOL/HDL Ratio: 3.6 Ratio (ref <=5.0)
VLDL: 57 mg/dL — AB (ref <30)

## 2016-01-04 LAB — MAGNESIUM: Magnesium: 2.2 mg/dL (ref 1.5–2.5)

## 2016-05-03 ENCOUNTER — Other Ambulatory Visit: Payer: Self-pay | Admitting: Physician Assistant

## 2016-05-08 DIAGNOSIS — D1801 Hemangioma of skin and subcutaneous tissue: Secondary | ICD-10-CM | POA: Diagnosis not present

## 2016-05-08 DIAGNOSIS — D225 Melanocytic nevi of trunk: Secondary | ICD-10-CM | POA: Diagnosis not present

## 2016-05-08 DIAGNOSIS — D485 Neoplasm of uncertain behavior of skin: Secondary | ICD-10-CM | POA: Diagnosis not present

## 2016-05-08 DIAGNOSIS — L821 Other seborrheic keratosis: Secondary | ICD-10-CM | POA: Diagnosis not present

## 2016-05-08 DIAGNOSIS — Z419 Encounter for procedure for purposes other than remedying health state, unspecified: Secondary | ICD-10-CM | POA: Diagnosis not present

## 2016-05-08 DIAGNOSIS — L570 Actinic keratosis: Secondary | ICD-10-CM | POA: Diagnosis not present

## 2016-05-08 DIAGNOSIS — L814 Other melanin hyperpigmentation: Secondary | ICD-10-CM | POA: Diagnosis not present

## 2016-06-03 ENCOUNTER — Other Ambulatory Visit: Payer: Self-pay | Admitting: Physician Assistant

## 2016-06-03 DIAGNOSIS — E039 Hypothyroidism, unspecified: Secondary | ICD-10-CM

## 2016-06-24 DIAGNOSIS — R7301 Impaired fasting glucose: Secondary | ICD-10-CM | POA: Diagnosis not present

## 2016-06-24 DIAGNOSIS — E890 Postprocedural hypothyroidism: Secondary | ICD-10-CM | POA: Diagnosis not present

## 2016-06-25 ENCOUNTER — Ambulatory Visit (INDEPENDENT_AMBULATORY_CARE_PROVIDER_SITE_OTHER): Payer: Medicare Other | Admitting: Physician Assistant

## 2016-06-25 ENCOUNTER — Encounter: Payer: Self-pay | Admitting: Physician Assistant

## 2016-06-25 VITALS — BP 108/60 | HR 76 | Temp 97.7°F | Resp 16 | Ht 68.5 in | Wt 142.8 lb

## 2016-06-25 DIAGNOSIS — F325 Major depressive disorder, single episode, in full remission: Secondary | ICD-10-CM

## 2016-06-25 DIAGNOSIS — E782 Mixed hyperlipidemia: Secondary | ICD-10-CM

## 2016-06-25 DIAGNOSIS — D649 Anemia, unspecified: Secondary | ICD-10-CM

## 2016-06-25 DIAGNOSIS — E039 Hypothyroidism, unspecified: Secondary | ICD-10-CM | POA: Diagnosis not present

## 2016-06-25 DIAGNOSIS — R030 Elevated blood-pressure reading, without diagnosis of hypertension: Secondary | ICD-10-CM | POA: Diagnosis not present

## 2016-06-25 DIAGNOSIS — R7309 Other abnormal glucose: Secondary | ICD-10-CM | POA: Diagnosis not present

## 2016-06-25 DIAGNOSIS — R6889 Other general symptoms and signs: Secondary | ICD-10-CM | POA: Diagnosis not present

## 2016-06-25 DIAGNOSIS — Z0000 Encounter for general adult medical examination without abnormal findings: Secondary | ICD-10-CM

## 2016-06-25 DIAGNOSIS — R918 Other nonspecific abnormal finding of lung field: Secondary | ICD-10-CM

## 2016-06-25 DIAGNOSIS — E559 Vitamin D deficiency, unspecified: Secondary | ICD-10-CM

## 2016-06-25 DIAGNOSIS — F419 Anxiety disorder, unspecified: Secondary | ICD-10-CM

## 2016-06-25 DIAGNOSIS — Z0001 Encounter for general adult medical examination with abnormal findings: Secondary | ICD-10-CM | POA: Diagnosis not present

## 2016-06-25 DIAGNOSIS — Z79899 Other long term (current) drug therapy: Secondary | ICD-10-CM

## 2016-06-25 MED ORDER — ESCITALOPRAM OXALATE 10 MG PO TABS: 10.0000 mg | ORAL_TABLET | Freq: Every day | ORAL | 1 refills | Status: DC

## 2016-06-25 NOTE — Patient Instructions (Addendum)
[facility]  7 a.m.-6:30 p.m., Monday 7 a.m.-5 p.m., Tuesday-Friday Schedule an appointment by calling [phone].   Encourage you to get the 3D Mammogram  The 3D Mammogram is much more specific and sensitive to pick up breast cancer. For women with fibrocystic breast or lumpy breast it can be hard to determine if it is cancer or not but the 3D mammogram is able to tell this difference which cuts back on unneeded additional tests or scary call backs.   - over 40% increase in detection of breast cancer - over 40% reduction in false positives.  - fewer call backs - reduced anxiety - improved outcomes - [facility]   

## 2016-06-25 NOTE — Progress Notes (Signed)
MEDICARE ANNUAL WELLNESS VISIT AND FOLLOW UP  Assessment:   Other abnormal glucose Discussed general issues about diabetes pathophysiology and management., Educational material distributed., Suggested low cholesterol diet., Encouraged aerobic exercise., Discussed foot care., Reminded to get yearly retinal exam.  Mixed hyperlipidemia -continue medications, check lipids, decrease fatty foods, increase activity.  - Lipid panel   Elevated BP - CBC with Differential/Platelet - Hepatic function panel - Urinalysis, Routine w reflex microscopic (not at [facility]) - Microalbumin / creatinine urine ratio - EKG 12-Lead  Hypothyroidism, unspecified hypothyroidism type - levothyroxine (SYNTHROID, LEVOTHROID) 50 MCG tablet; Take 1 tablet (50 mcg total) by mouth daily before breakfast.  Dispense: 90 tablet; Refill: 4   Anxiety Doing much better, follow up 6 months - escitalopram (LEXAPRO) 10 MG tablet; Take 1 tablet (10 mg total) by mouth daily.  Dispense: 90 tablet; Refill: 4   Pulmonary nodules Declines CT scan due to radiation, only smoked 5 years, no symptoms at this time, long discussion and declines, will continue to discuss  Medicare Wellness Has cat D breast, suggest 3D MGM  Will get labs at 6 month OV Tetanus next OV   Plan:   During the course of the visit the patient was educated and counseled about appropriate screening and preventive services including:    Pneumococcal vaccine   Influenza vaccine  Td vaccine  Screening electrocardiogram  Screening mammography  Bone densitometry screening  Colorectal cancer screening  Diabetes screening  Glaucoma screening  Nutrition counseling   Advanced directives: given info/requested   Subjective:   Debra Hayes is a 71 y.o. female who presents for Medicare Annual Wellness Visit and 3 month follow up on hypertension, prediabetes, hyperlipidemia, vitamin D def.   Her blood pressure has been controlled at home, today  their BP is BP: 108/60 She does workout. She denies chest pain, shortness of breath, dizziness.  She has history of pulmonary lung nodules on cardiac index score but she is very low risk for lung cancer and declines follow up due to radiology.  She is not on cholesterol medication and denies myalgias. Her cholesterol is at goal. The cholesterol last visit was:  Lab Results  Component Value Date   CHOL 228 (H) 01/03/2016   HDL 63 01/03/2016   LDLCALC 108 01/03/2016   TRIG 283 (H) 01/03/2016   CHOLHDL 3.6 01/03/2016     She has been working on diet and exercise for prediabetes, and denies paresthesia of the feet, polydipsia and polyuria. Her GFR was 75 with her BUN being elevated at 23., Last A1C in the office was:  Lab Results  Component Value Date   HGBA1C 5.7 (H) 01/03/2016   Patient is on Vitamin D supplement, she is on 1000 every day.  Lab Results  Component Value Date   VD25OH 17 (L) 03/20/2015   She is on thyroid medication, she has been seeing Dr. Chalmers Cater for her TSH, checked recently in Jan, was normal. Her medication was not changed last visit was Lab Results  Component Value Date   TSH 1.40 01/03/2016   She has anxiety/depression, started on lexapro and has done remarkable well, she was seeing a counselor but she has moved so she is now longer seeing one but is doing well at this time.    Names of Other Physician/Practitioners you currently use: 1. [facility]- here for primary care 2. Dr. Trenton Gammon, dentist 3. Dr. Chalmers Cater 4. Dr. Sabra Heck, has appt Dec Patient Care Team: Unk Pinto, MD as PCP -  General (Internal Medicine) Rozetta Nunnery, MD as Consulting Physician (Otolaryngology) Melissa Noon, OD as Referring Physician (Optometry) Rolm Bookbinder, MD as Consulting Physician (Dermatology) Irene Shipper, MD as Consulting Physician (Gastroenterology) Jacelyn Pi, MD as Consulting Physician (Endocrinology)  Medication Review Current  Outpatient Prescriptions on File Prior to Visit  Medication Sig Dispense Refill  . escitalopram (LEXAPRO) 10 MG tablet TAKE 1 TABLET BY MOUTH EVERY DAY 90 tablet 0  . levothyroxine (SYNTHROID, LEVOTHROID) 50 MCG tablet TAKE 1 TABLET BY MOUTH EVERY DAY BEFORE BREAKFAST 90 tablet 0   No current facility-administered medications on file prior to visit.     Current Problems (verified) Patient Active Problem List   Diagnosis Date Noted  . Elevated blood pressure, situational 03/20/2015  . Mixed hyperlipidemia 03/20/2015  . Other abnormal glucose 03/20/2015  . Vitamin D deficiency disease 03/20/2015  . Pulmonary nodules 08/23/2014  . Medication management 05/17/2014  . Anemia   . Anxiety   . Depression, major, in remission ([place])   . Hypothyroidism     Screening Tests Immunization History  Administered Date(s) Administered  . Tdap 06/17/2006   Preventative care: Last colonoscopy: 2014 EGD 2014 normal Last mammogram: 06/2014. CAT D, right simple cyst, suggest 3D Last pap smear/pelvic exam: 2011 normal, never had abnormal, declines LMP age 43  DEXA: 06/2014 normal MRI head normal 2004 Thyroid US 2014 unchanged Stress test 11/2012  Prior vaccinations: TD or Tdap: 2008 DUE Influenza: 2008, declines Pneumococcal: declines Prevnar13:  declines Shingles/Zostavax: declines  Allergies No Known Allergies  SURGICAL HISTORY She  has a past surgical history that includes Thyroid cyst excision and Tonsillectomy and adenoidectomy. FAMILY HISTORY Her family history includes Bladder Cancer in her mother; Heart disease (age of onset: 68) in her father; Thyroid cancer in her father. SOCIAL HISTORY She  reports that she quit smoking about 48 years ago. Her smoking use included Cigarettes. She has never used smokeless tobacco. She reports that she drinks alcohol. She reports that she does not use drugs.  MEDICARE WELLNESS OBJECTIVES: Physical activity: Current Exercise Habits: Structured  exercise class, Type of exercise: Other - see comments (zumba), Time (Minutes): 40, Frequency (Times/Week): 3, Weekly Exercise (Minutes/Week): 120, Intensity: Moderate Cardiac risk factors: Cardiac Risk Factors include: advanced age (>49men, >69 women);dyslipidemia;hypertension Depression/mood screen:   Depression screen [facility] 2/9 06/25/2016  Decreased Interest 0  Down, Depressed, Hopeless 0  PHQ - 2 Score 0  Altered sleeping -  Tired, decreased energy -  Change in appetite -  Feeling bad or failure about yourself  -  Trouble concentrating -  Moving slowly or fidgety/restless -  Suicidal thoughts -  PHQ-9 Score -    ADLs:  In your present state of health, do you have any difficulty performing the following activities: 06/25/2016 06/29/2015  Hearing? N N  Vision? N N  Difficulty concentrating or making decisions? N N  Walking or climbing stairs? N N  Dressing or bathing? N N  Doing errands, shopping? N N  Preparing Food and eating ? - N  Using the Toilet? - N  In the past six months, have you accidently leaked urine? - N  Do you have problems with loss of bowel control? - N  Managing your Medications? - N  Managing your Finances? - N  Housekeeping or managing your Housekeeping? - N  Some recent data might be hidden    Cognitive Testing  Alert? Yes  Normal Appearance?Yes  Oriented to person? Yes  Place? Yes   Time? Yes  Recall of three objects?  Yes  Can perform simple calculations? Yes  Displays appropriate judgment?Yes  Can read the correct time from a watch face?Yes  EOL planning: Does Patient Have a Medical Advance Directive?: Yes Type of Advance Directive: Healthcare Power of Attorney, Living will Copy of [place] in Chart?: No - copy requested  Review of Systems  Constitutional: Negative.   HENT: Negative.   Eyes: Negative.   Respiratory: Negative.   Cardiovascular: Negative.   Gastrointestinal: Negative.   Genitourinary: Negative.    Musculoskeletal: Negative.   Skin: Negative.   Neurological: Negative.   Endo/Heme/Allergies: Negative.   Psychiatric/Behavioral: Negative.     Objective:   Blood pressure 108/60, pulse 76, temperature 97.7 F (36.5 C), resp. rate 16, height 5' 8.5" (1.74 m), weight 142 lb 12.8 oz (64.8 kg), SpO2 95 %. Body mass index is 21.4 kg/m.  General appearance: alert, no distress, WD/WN,  female HEENT: normocephalic, sclerae anicteric, TMs pearly, nares patent, no discharge or erythema, pharynx normal Oral cavity: MMM, no lesions Neck: supple, no lymphadenopathy, no thyromegaly, no masses Heart: RRR, normal S1, S2, no murmurs Lungs: CTA bilaterally, no wheezes, rhonchi, or rales Abdomen: +bs, soft, non tender, non distended, no masses, no hepatomegaly, no splenomegaly Musculoskeletal: nontender, no swelling, no obvious deformity Extremities: no edema, no cyanosis, no clubbing Pulses: 2+ symmetric, upper and lower extremities, normal cap refill Neurological: alert, oriented x 3, CN2-12 intact, strength normal upper extremities and lower extremities, sensation normal throughout, DTRs 2+ throughout, no cerebellar signs, gait normal Psychiatric: normal affect, behavior normal, pleasant  Breast: defer Gyn: defer Rectal: defer  Medicare Attestation I have personally reviewed: The patient's medical and social history Their use of alcohol, tobacco or illicit drugs Their current medications and supplements The patient's functional ability including ADLs,fall risks, home safety risks, cognitive, and hearing and visual impairment Diet and physical activities Evidence for depression or mood disorders  The patient's weight, height, BMI, and visual acuity have been recorded in the chart.  I have made referrals, counseling, and provided education to the patient based on review of the above and I have provided the patient with a written personalized care plan for preventive services.     Vicie Mutters, PA-C   06/25/2016

## 2016-07-01 DIAGNOSIS — E890 Postprocedural hypothyroidism: Secondary | ICD-10-CM | POA: Diagnosis not present

## 2016-07-01 DIAGNOSIS — R7301 Impaired fasting glucose: Secondary | ICD-10-CM | POA: Diagnosis not present

## 2016-07-01 DIAGNOSIS — C73 Malignant neoplasm of thyroid gland: Secondary | ICD-10-CM | POA: Diagnosis not present

## 2016-08-03 ENCOUNTER — Other Ambulatory Visit: Payer: Self-pay | Admitting: Internal Medicine

## 2016-09-16 ENCOUNTER — Other Ambulatory Visit: Payer: Self-pay | Admitting: Internal Medicine

## 2016-09-16 DIAGNOSIS — E039 Hypothyroidism, unspecified: Secondary | ICD-10-CM

## 2016-10-31 ENCOUNTER — Other Ambulatory Visit: Payer: Self-pay | Admitting: Internal Medicine

## 2016-11-13 ENCOUNTER — Other Ambulatory Visit: Payer: Self-pay | Admitting: Internal Medicine

## 2016-11-13 DIAGNOSIS — Z1231 Encounter for screening mammogram for malignant neoplasm of breast: Secondary | ICD-10-CM

## 2016-11-22 ENCOUNTER — Ambulatory Visit
Admission: RE | Admit: 2016-11-22 | Discharge: 2016-11-22 | Disposition: A | Discharge status: Home / Self Care | Payer: Medicare Other | Source: Ambulatory Visit | Attending: Internal Medicine | Admitting: Internal Medicine

## 2016-11-22 DIAGNOSIS — Z1231 Encounter for screening mammogram for malignant neoplasm of breast: Secondary | ICD-10-CM

## 2016-12-19 NOTE — Progress Notes (Signed)
Assessment and Plan: Anxiety continue medications, stress management techniques discussed, increase water, good sleep hygiene discussed, increase exercise, and increase veggies.   lexapro 10mg   Hypothyroidism, unspecified hypothyroidism type Hypothyroidism-check TSH level, continue medications the same, reminded to take on an empty stomach 30-7mins before food.  - TSH  Prediabetes Discussed general issues about diabetes pathophysiology and management., Educational material distributed., Suggested low cholesterol diet., Encouraged aerobic exercise., Discussed foot care., Reminded to get yearly retinal exam. - CBC with Differential/Platelet - BASIC METABOLIC PANEL WITH GFR - Hepatic function panel  Vitamin D deficiency Get back on vitamin D  Future Appointments Date Time Provider [place]  06/30/2017 2:00 PM Vicie Mutters, PA-C [facility] None     HPI 71 y.o.female presents for follow up for anxiety, thyroid, vitamin D def.  She is currently on lexapro 10mg  and doing very well with this medication and dose for anxiety.  Her blood pressure has been controlled at home, today their BP is BP: 126/84  She does workout, she does zumba, volunteers. She denies chest pain, shortness of breath, dizziness.  She is not on cholesterol medication and denies myalgias. Her cholesterol is at goal. The cholesterol last visit was:   Lab Results  Component Value Date   CHOL 228 (H) 01/03/2016   HDL 63 01/03/2016   LDLCALC 108 01/03/2016   TRIG 283 (H) 01/03/2016   CHOLHDL 3.6 01/03/2016   She has been working on diet and exercise for prediabetes, and denies paresthesia of the feet, polydipsia, polyuria and visual disturbances. Last A1C in the office was:  Lab Results  Component Value Date   HGBA1C 5.7 (H) 01/03/2016  She is on vitamin D for def, 50,000 once a week.  Lab Results  Component Value Date   VD25OH 29 (L) 03/20/2015  She is on thyroid medication. Her medication was not  changed last visit.  Lab Results  Component Value Date   TSH 1.40 01/03/2016      Past Medical History:  Diagnosis Date  . Anemia   . Anxiety   . At high risk for altered glucose metabolism   . Cancer ([place])   . Depression   . Hypothyroidism   . RLS (restless legs syndrome)      No Known Allergies    Current Outpatient Prescriptions on File Prior to Visit  Medication Sig Dispense Refill  . escitalopram (LEXAPRO) 10 MG tablet TAKE 1 TABLET BY MOUTH EVERY DAY 90 tablet 0  . levothyroxine (SYNTHROID, LEVOTHROID) 50 MCG tablet TAKE 1 TABLET BY MOUTH EVERY DAY BEFORE BREAKFAST 90 tablet 0   No current facility-administered medications on file prior to visit.     ROS: all negative except above.   Physical Exam: BP 126/84   Pulse 78   Temp 97.6 F (36.4 C)   Resp 14   Ht 5' 8.5" (1.74 m)   Wt 136 lb 9.6 oz (62 kg)   SpO2 98%   BMI 20.47 kg/m   Wt Readings from Last 3 Encounters:  12/23/16 136 lb 9.6 oz (62 kg)  06/25/16 142 lb 12.8 oz (64.8 kg)  01/03/16 140 lb 3.2 oz (63.6 kg)   General Appearance: Well nourished, in no apparent distress. Eyes: PERRLA, EOMs, conjunctiva no swelling or erythema Sinuses: No Frontal/maxillary tenderness ENT/Mouth: Ext aud canals clear, TMs without erythema, bulging. No erythema, swelling, or exudate on post pharynx.  Tonsils not swollen or erythematous. Hearing normal.  Neck: Supple, thyroid normal.  Respiratory: Respiratory effort normal, BS equal bilaterally  without rales, rhonchi, wheezing or stridor.  Cardio: RRR with no MRGs. Brisk peripheral pulses without edema.  Abdomen: Soft, + BS.  Non tender, no guarding, rebound, hernias, masses. Lymphatics: Non tender without lymphadenopathy.  Musculoskeletal: Full ROM, 5/5 strength, normal gait.  Skin: Warm, dry without rashes, lesions, ecchymosis.  Neuro: Cranial nerves intact. Normal muscle tone, no cerebellar symptoms. Sensation intact.  Psych: Awake and oriented X 3, normal affect,  Insight and Judgment appropriate.     Vicie Mutters, PA-C 2:39 PM [facility]

## 2016-12-23 ENCOUNTER — Ambulatory Visit (INDEPENDENT_AMBULATORY_CARE_PROVIDER_SITE_OTHER): Payer: Medicare Other | Admitting: Physician Assistant

## 2016-12-23 ENCOUNTER — Encounter: Payer: Self-pay | Admitting: Physician Assistant

## 2016-12-23 VITALS — BP 126/84 | HR 78 | Temp 97.6°F | Resp 14 | Ht 68.5 in | Wt 136.6 lb

## 2016-12-23 DIAGNOSIS — E782 Mixed hyperlipidemia: Secondary | ICD-10-CM

## 2016-12-23 DIAGNOSIS — R030 Elevated blood-pressure reading, without diagnosis of hypertension: Secondary | ICD-10-CM | POA: Diagnosis not present

## 2016-12-23 DIAGNOSIS — E039 Hypothyroidism, unspecified: Secondary | ICD-10-CM

## 2016-12-23 DIAGNOSIS — R7309 Other abnormal glucose: Secondary | ICD-10-CM | POA: Diagnosis not present

## 2016-12-23 DIAGNOSIS — Z79899 Other long term (current) drug therapy: Secondary | ICD-10-CM | POA: Diagnosis not present

## 2016-12-23 DIAGNOSIS — F325 Major depressive disorder, single episode, in full remission: Secondary | ICD-10-CM | POA: Diagnosis not present

## 2016-12-23 LAB — CBC WITH DIFFERENTIAL/PLATELET
BASOS ABS: 0 {cells}/uL (ref 0–200)
BASOS PCT: 0 %
EOS ABS: 77 {cells}/uL (ref 15–500)
Eosinophils Relative: 1 %
HEMATOCRIT: 42.3 % (ref 35.0–45.0)
HEMOGLOBIN: 14.2 g/dL (ref 11.7–15.5)
LYMPHS ABS: 2618 {cells}/uL (ref 850–3900)
Lymphocytes Relative: 34 %
MCH: 30.8 pg (ref 27.0–33.0)
MCHC: 33.6 g/dL (ref 32.0–36.0)
MCV: 91.8 fL (ref 80.0–100.0)
MONO ABS: 462 {cells}/uL (ref 200–950)
MONOS PCT: 6 %
MPV: 10.6 fL (ref 7.5–12.5)
NEUTROS ABS: 4543 {cells}/uL (ref 1500–7800)
Neutrophils Relative %: 59 %
PLATELETS: 249 10*3/uL (ref 140–400)
RBC: 4.61 MIL/uL (ref 3.80–5.10)
RDW: 13.1 % (ref 11.0–15.0)
WBC: 7.7 10*3/uL (ref 3.8–10.8)

## 2016-12-23 LAB — HEPATIC FUNCTION PANEL
ALBUMIN: 4.3 g/dL (ref 3.6–5.1)
ALK PHOS: 67 U/L (ref 33–130)
ALT: 15 U/L (ref 6–29)
AST: 21 U/L (ref 10–35)
BILIRUBIN TOTAL: 0.6 mg/dL (ref 0.2–1.2)
Bilirubin, Direct: 0.1 mg/dL (ref <=0.2)
Indirect Bilirubin: 0.5 mg/dL (ref 0.2–1.2)
Total Protein: 6.6 g/dL (ref 6.1–8.1)

## 2016-12-23 LAB — BASIC METABOLIC PANEL WITH GFR
BUN: 25 mg/dL (ref 7–25)
CHLORIDE: 107 mmol/L (ref 98–110)
CO2: 23 mmol/L (ref 20–31)
CREATININE: 1.08 mg/dL — AB (ref 0.60–0.93)
Calcium: 9.2 mg/dL (ref 8.6–10.4)
GFR, Est African American: 60 mL/min (ref >=60)
GFR, Est Non African American: 52 mL/min — ABNORMAL LOW (ref >=60)
GLUCOSE: 96 mg/dL (ref 65–99)
POTASSIUM: 4.4 mmol/L (ref 3.5–5.3)
Sodium: 141 mmol/L (ref 135–146)

## 2016-12-23 LAB — TSH: TSH: 1.28 m[IU]/L

## 2016-12-23 NOTE — Patient Instructions (Signed)
  Vitamin D goal is between 60-80  Please make sure that you are taking your Vitamin D as directed.   It is very important as a natural anti-inflammatory   helping hair, skin, and nails, as well as reducing stroke and heart attack risk.   It helps your bones and helps with mood.  We want you on at least 5000 IU daily  It also decreases numerous cancer risks so please take it as directed.   Low Vit D is associated with a 200-300% higher risk for CANCER   and 200-300% higher risk for HEART   ATTACK  &  STROKE.    ......................................  It is also associated with higher death rate at younger ages,   autoimmune diseases like Rheumatoid arthritis, Lupus, Multiple Sclerosis.     Also many other serious conditions, like depression, Alzheimer's  Dementia, infertility, muscle aches, fatigue, fibromyalgia - just to name a few.  +++++++++++++++++++  Can get liquid vitamin D from [employer]  OR here in [place] at  [facility] [address], [place], [place] [address] Or you can try [employer]    

## 2016-12-24 NOTE — Progress Notes (Signed)
Pt aware of lab results & voiced understanding of those results.

## 2016-12-24 NOTE — Progress Notes (Signed)
LVM FOR PT TO RETURN OFFICE CALL

## 2016-12-30 ENCOUNTER — Other Ambulatory Visit: Payer: Self-pay | Admitting: Internal Medicine

## 2016-12-30 DIAGNOSIS — E039 Hypothyroidism, unspecified: Secondary | ICD-10-CM

## 2017-04-28 ENCOUNTER — Other Ambulatory Visit: Payer: Self-pay | Admitting: Internal Medicine

## 2017-06-27 NOTE — Progress Notes (Signed)
MEDICARE ANNUAL WELLNESS VISIT AND FOLLOW UP  Assessment:   Other abnormal glucose Discussed general issues about diabetes pathophysiology and management., Educational material distributed., Suggested low cholesterol diet., Encouraged aerobic exercise., Discussed foot care., Reminded to get yearly retinal exam.  Mixed hyperlipidemia -continue medications, check lipids, decrease fatty foods, increase activity.  - Lipid panel   Elevated BP - CBC with Differential/Platelet - Hepatic function panel - Urinalysis, Routine w reflex microscopic (not at [facility]) - Microalbumin / creatinine urine ratio - EKG 12-Lead  Hypothyroidism, unspecified hypothyroidism type Gets checked with Dr. Chalmers Cater   Anxiety Doing much better, follow up 1 year unless any changes - escitalopram (LEXAPRO) 10 MG tablet; Take 1 tablet (10 mg total) by mouth daily.  Dispense: 90 tablet; Refill: 4   Pulmonary nodules Low risk, does not need follow up and patient declines.   Medicare Wellness Has cat D breast, suggest 3D MGM  No future appointments.  Plan:   During the course of the visit the patient was educated and counseled about appropriate screening and preventive services including:    Pneumococcal vaccine   Influenza vaccine  Td vaccine  Screening electrocardiogram  Screening mammography  Bone densitometry screening  Colorectal cancer screening  Diabetes screening  Glaucoma screening  Nutrition counseling   Advanced directives: given info/requested   Subjective:   Debra Hayes is a 72 y.o. female who presents for Medicare Annual Wellness Visit and 3 month follow up on hypertension, prediabetes, hyperlipidemia, vitamin D def.   Her blood pressure has been controlled at home, today their BP is BP: 118/70 She does workout. She denies chest pain, shortness of breath, dizziness.  She has history of pulmonary lung nodules on cardiac index score but she is very low risk for lung cancer and  declines follow up due to radiology.  She is not on cholesterol medication and denies myalgias. Her cholesterol is at goal. The cholesterol last visit was:  Lab Results  Component Value Date   CHOL 228 (H) 01/03/2016   HDL 63 01/03/2016   LDLCALC 108 01/03/2016   TRIG 283 (H) 01/03/2016   CHOLHDL 3.6 01/03/2016     She has been working on diet and exercise for prediabetes, and denies paresthesia of the feet, polydipsia and polyuria.  Last A1C in the office was:  Lab Results  Component Value Date   HGBA1C 5.7 (H) 01/03/2016   Lab Results  Component Value Date   GFRNONAA 52 (L) 12/23/2016   Patient is on Vitamin D supplement, she is on 1000 every day.  Lab Results  Component Value Date   VD25OH 29 (L) 03/20/2015   She is on thyroid medication. Her medication was not changed last visit was Lab Results  Component Value Date   TSH 1.28 12/23/2016   She has anxiety/depression, started on lexapro and has done remarkable well, she was seeing a counselor but she has moved so she is now longer seeing one but is doing well at this time.   BMI is Body mass index is 21.25 kg/m., she is working on diet and exercise. Wt Readings from Last 3 Encounters:  06/30/17 141 lb 12.8 oz (64.3 kg)  12/23/16 136 lb 9.6 oz (62 kg)  06/25/16 142 lb 12.8 oz (64.8 kg)    Names of Other Physician/Practitioners you currently use: 1. [facility]- here for primary care 2. Dr. Trenton Gammon, dentist 3. Dr. Chalmers Cater 4. Dr. Sabra Heck, has appt Dec Patient Care Team: Unk Pinto, MD as PCP -  General (Internal Medicine) Rozetta Nunnery, MD as Consulting Physician (Otolaryngology) Melissa Noon, [place] as Referring Physician (Optometry) Rolm Bookbinder, MD as Consulting Physician (Dermatology) Irene Shipper, MD as Consulting Physician (Gastroenterology) Jacelyn Pi, MD as Consulting Physician (Endocrinology)  Medication Review Current Outpatient Medications on File Prior to  Visit  Medication Sig Dispense Refill  . escitalopram (LEXAPRO) 10 MG tablet TAKE 1 TABLET BY MOUTH EVERY DAY 90 tablet 1  . levothyroxine (SYNTHROID, LEVOTHROID) 50 MCG tablet TAKE 1 TABLET BY MOUTH EVERY DAY BEFORE BREAKFAST 90 tablet 1   No current facility-administered medications on file prior to visit.     Current Problems (verified) Patient Active Problem List   Diagnosis Date Noted  . Elevated blood pressure, situational 03/20/2015  . Mixed hyperlipidemia 03/20/2015  . Other abnormal glucose 03/20/2015  . Vitamin D deficiency disease 03/20/2015  . Pulmonary nodules 08/23/2014  . Medication management 05/17/2014  . Anemia   . Anxiety   . Depression, major, in remission ([place])   . Hypothyroidism     Screening Tests Immunization History  Administered Date(s) Administered  . Td 06/30/2017  . Tdap 06/17/2006   Preventative care: Last colonoscopy: 2014 the last one, due 10 years EGD 2014 normal Last mammogram: 11/2016. CAT D, right simple cyst, suggest 3D Last pap smear/pelvic exam: 2011 normal, never had abnormal, declines LMP age 59  DEXA: 06/2014 normal MRI head normal 2004 Thyroid US 2014 unchanged Stress test 11/2012  Prior vaccinations: TD or Tdap: 2008 TODAY Influenza: 2008, declines Pneumococcal: declines Prevnar13:  declines Shingles/Zostavax: declines  Allergies No Known Allergies  SURGICAL HISTORY She  has a past surgical history that includes Thyroid cyst excision and Tonsillectomy and adenoidectomy. FAMILY HISTORY Her family history includes Bladder Cancer in her mother; Heart disease (age of onset: 73) in her father; Thyroid cancer in her father. SOCIAL HISTORY She  reports that she quit smoking about 49 years ago. Her smoking use included cigarettes. she has never used smokeless tobacco. She reports that she drinks alcohol. She reports that she does not use drugs.  MEDICARE WELLNESS OBJECTIVES: Physical activity: Current Exercise Habits: Home  exercise routine;Structured exercise class, Time (Minutes): 60, Frequency (Times/Week): 3, Weekly Exercise (Minutes/Week): 180, Intensity: Moderate Cardiac risk factors: Cardiac Risk Factors include: advanced age (>70men, >42 women);dyslipidemia;hypertension Depression/mood screen:   Depression screen [facility] 2/9 06/30/2017  Decreased Interest 0  Down, Depressed, Hopeless 0  PHQ - 2 Score 0  Altered sleeping -  Tired, decreased energy -  Change in appetite -  Feeling bad or failure about yourself  -  Trouble concentrating -  Moving slowly or fidgety/restless -  Suicidal thoughts -  PHQ-9 Score -    ADLs:  In your present state of health, do you have any difficulty performing the following activities: 06/30/2017  Hearing? N  Vision? N  Difficulty concentrating or making decisions? N  Walking or climbing stairs? N  Dressing or bathing? N  Doing errands, shopping? N  Some recent data might be hidden    Cognitive Testing  Alert? Yes  Normal Appearance?Yes  Oriented to person? Yes  Place? Yes   Time? Yes  Recall of three objects?  Yes  Can perform simple calculations? Yes  Displays appropriate judgment?Yes  Can read the correct time from a watch face?Yes  EOL planning: Does Patient Have a Medical Advance Directive?: Yes Type of Advance Directive: Healthcare Power of Attorney, Living will Copy of [place] in Chart?: No - copy requested  Review  of Systems  Constitutional: Negative.   HENT: Negative.   Eyes: Negative.   Respiratory: Negative.   Cardiovascular: Negative.   Gastrointestinal: Negative.   Genitourinary: Negative.   Musculoskeletal: Negative.   Skin: Negative.   Neurological: Negative.   Endo/Heme/Allergies: Negative.   Psychiatric/Behavioral: Negative.     Objective:   Blood pressure 118/70, pulse 86, temperature (!) 97.5 F (36.4 C), resp. rate 16, height 5' 8.5" (1.74 m), weight 141 lb 12.8 oz (64.3 kg), SpO2 98 %. Body mass index is  21.25 kg/m.  General appearance: alert, no distress, WD/WN,  female HEENT: normocephalic, sclerae anicteric, TMs pearly, nares patent, no discharge or erythema, pharynx normal Oral cavity: MMM, no lesions Neck: supple, no lymphadenopathy, no thyromegaly, no masses Heart: RRR, normal S1, S2, no murmurs Lungs: CTA bilaterally, no wheezes, rhonchi, or rales Abdomen: +bs, soft, non tender, non distended, no masses, no hepatomegaly, no splenomegaly Musculoskeletal: nontender, no swelling, no obvious deformity Extremities: no edema, no cyanosis, no clubbing Pulses: 2+ symmetric, upper and lower extremities, normal cap refill Neurological: alert, oriented x 3, CN2-12 intact, strength normal upper extremities and lower extremities, sensation normal throughout, DTRs 2+ throughout, no cerebellar signs, gait normal Psychiatric: normal affect, behavior normal, pleasant  Breast: defer Gyn: defer Rectal: defer  Medicare Attestation I have personally reviewed: The patient's medical and social history Their use of alcohol, tobacco or illicit drugs Their current medications and supplements The patient's functional ability including ADLs,fall risks, home safety risks, cognitive, and hearing and visual impairment Diet and physical activities Evidence for depression or mood disorders  The patient's weight, height, BMI, and visual acuity have been recorded in the chart.  I have made referrals, counseling, and provided education to the patient based on review of the above and I have provided the patient with a written personalized care plan for preventive services.     Vicie Mutters, PA-C   06/30/2017

## 2017-06-30 ENCOUNTER — Ambulatory Visit (INDEPENDENT_AMBULATORY_CARE_PROVIDER_SITE_OTHER): Payer: Medicare Other | Admitting: Physician Assistant

## 2017-06-30 ENCOUNTER — Encounter: Payer: Self-pay | Admitting: Physician Assistant

## 2017-06-30 VITALS — BP 118/70 | HR 86 | Temp 97.5°F | Resp 16 | Ht 68.5 in | Wt 141.8 lb

## 2017-06-30 DIAGNOSIS — E782 Mixed hyperlipidemia: Secondary | ICD-10-CM | POA: Diagnosis not present

## 2017-06-30 DIAGNOSIS — Z0001 Encounter for general adult medical examination with abnormal findings: Secondary | ICD-10-CM | POA: Diagnosis not present

## 2017-06-30 DIAGNOSIS — R030 Elevated blood-pressure reading, without diagnosis of hypertension: Secondary | ICD-10-CM

## 2017-06-30 DIAGNOSIS — F325 Major depressive disorder, single episode, in full remission: Secondary | ICD-10-CM

## 2017-06-30 DIAGNOSIS — Z79899 Other long term (current) drug therapy: Secondary | ICD-10-CM

## 2017-06-30 DIAGNOSIS — R7309 Other abnormal glucose: Secondary | ICD-10-CM | POA: Diagnosis not present

## 2017-06-30 DIAGNOSIS — F419 Anxiety disorder, unspecified: Secondary | ICD-10-CM

## 2017-06-30 DIAGNOSIS — Z136 Encounter for screening for cardiovascular disorders: Secondary | ICD-10-CM

## 2017-06-30 DIAGNOSIS — R6889 Other general symptoms and signs: Secondary | ICD-10-CM

## 2017-06-30 DIAGNOSIS — Z6821 Body mass index (BMI) 21.0-21.9, adult: Secondary | ICD-10-CM

## 2017-06-30 DIAGNOSIS — Z23 Encounter for immunization: Secondary | ICD-10-CM

## 2017-06-30 DIAGNOSIS — E039 Hypothyroidism, unspecified: Secondary | ICD-10-CM

## 2017-06-30 DIAGNOSIS — E559 Vitamin D deficiency, unspecified: Secondary | ICD-10-CM

## 2017-06-30 DIAGNOSIS — R7301 Impaired fasting glucose: Secondary | ICD-10-CM | POA: Diagnosis not present

## 2017-06-30 DIAGNOSIS — E890 Postprocedural hypothyroidism: Secondary | ICD-10-CM | POA: Diagnosis not present

## 2017-06-30 DIAGNOSIS — D649 Anemia, unspecified: Secondary | ICD-10-CM

## 2017-06-30 DIAGNOSIS — C73 Malignant neoplasm of thyroid gland: Secondary | ICD-10-CM | POA: Diagnosis not present

## 2017-06-30 DIAGNOSIS — R918 Other nonspecific abnormal finding of lung field: Secondary | ICD-10-CM | POA: Diagnosis not present

## 2017-06-30 DIAGNOSIS — Z0000 Encounter for general adult medical examination without abnormal findings: Secondary | ICD-10-CM

## 2017-06-30 NOTE — Patient Instructions (Signed)
Debra Hayes Tenosynovitis  Get thumb spica splint and wear it at night for 4-6 weeks  Tendons attach muscles to bones. They also help with joint movements. When tendons become irritated or swollen, it is called tendinitis. The extensor pollicis brevis (EPB) tendon connects the EPB muscle to a bone that is near the base of the thumb. The EPB muscle helps to straighten and extend the thumb. [place] tenosynovitis is a condition in which the EPB tendon lining (sheath) becomes irritated, thickened, and swollen. This condition is sometimes called stenosing tenosynovitis. This condition causes pain on the thumb side of the back of the wrist. What are the causes? Causes of this condition include:  Activities that repeatedly cause your thumb and wrist to extend.  A sudden increase in activity or change in activity that affects your wrist.  What increases the risk? This condition is more likely to develop in:  Females.  People who have diabetes.  Women who have recently given birth.  People who are over 7 years of age.  People who do activities that involve repeated hand and wrist motions, such as tennis, racquetball, volleyball, gardening, and taking care of children.  People who do heavy labor.  People who have poor wrist strength and flexibility.  People who do not warm up properly before activities.  What are the signs or symptoms? Symptoms of this condition include:  Pain or tenderness over the thumb side of the back of the wrist when your thumb and wrist are not moving.  Pain that gets worse when you straighten your thumb or extend your thumb or wrist.  Pain when the injured area is touched.  Locking or catching of the thumb joint while you bend and straighten your thumb.  Decreased thumb motion due to pain.  Swelling over the affected area.  How is this diagnosed? This condition is diagnosed with a medical history and physical exam. Your health care provider will ask  for details about your injury and ask about your symptoms. How is this treated? Treatment may include the use of icing and medicines to reduce pain and swelling. You may also be advised to wear a splint or brace to limit your thumb and wrist motion. In less severe cases, treatment may also include working with a physical therapist to strengthen your wrist and calm the irritation around your EPB tendon sheath. In severe cases, surgery may be needed. Follow these instructions at home: If you have a splint or brace:  Wear it as told by your health care provider. Remove it only as told by your health care provider.  Loosen the splint or brace if your fingers become numb and tingle, or if they turn cold and blue.  Keep the splint or brace clean and dry. Managing pain, stiffness, and swelling  If directed, apply ice to the injured area. ? Put ice in a plastic bag. ? Place a towel between your skin and the bag. ? Leave the ice on for 20 minutes, 2-3 times per day.  Move your fingers often to avoid stiffness and to lessen swelling.  Raise (elevate) the injured area above the level of your heart while you are sitting or lying down. General instructions  Return to your normal activities as told by your health care provider. Ask your health care provider what activities are safe for you.  Take over-the-counter and prescription medicines only as told by your health care provider.  Keep all follow-up visits as told by your health care  provider. This is important.  Do not drive or operate heavy machinery while taking prescription pain medicine. Contact a health care provider if:  Your pain, tenderness, or swelling gets worse, even if you have had treatment.  You have numbness or tingling in your wrist, hand, or fingers on the injured side. This information is not intended to replace advice given to you by your health care provider. Make sure you discuss any questions you have with your health  care provider. Document Released: 06/03/2005 Document Revised: 11/09/2015 Document Reviewed: 08/09/2014 Elsevier Interactive Patient Education  [employer].

## 2017-07-01 LAB — BASIC METABOLIC PANEL WITH GFR
BUN / CREAT RATIO: 25 (calc) — AB (ref 6–22)
BUN: 26 mg/dL — AB (ref 7–25)
CO2: 30 mmol/L (ref 20–32)
Calcium: 9.7 mg/dL (ref 8.6–10.4)
Chloride: 104 mmol/L (ref 98–110)
Creat: 1.02 mg/dL — ABNORMAL HIGH (ref 0.60–0.93)
GFR, EST AFRICAN AMERICAN: 64 mL/min/{1.73_m2} (ref >=60)
GFR, EST NON AFRICAN AMERICAN: 55 mL/min/{1.73_m2} — AB (ref >=60)
GLUCOSE: 93 mg/dL (ref 65–99)
Potassium: 4.7 mmol/L (ref 3.5–5.3)
SODIUM: 140 mmol/L (ref 135–146)

## 2017-07-01 LAB — CBC WITH DIFFERENTIAL/PLATELET
BASOS ABS: 39 {cells}/uL (ref 0–200)
Basophils Relative: 0.5 %
EOS ABS: 94 {cells}/uL (ref 15–500)
Eosinophils Relative: 1.2 %
HCT: 42.1 % (ref 35.0–45.0)
HEMOGLOBIN: 14.1 g/dL (ref 11.7–15.5)
Lymphs Abs: 2114 cells/uL (ref 850–3900)
MCH: 29.8 pg (ref 27.0–33.0)
MCHC: 33.5 g/dL (ref 32.0–36.0)
MCV: 89 fL (ref 80.0–100.0)
MONOS PCT: 7.3 %
MPV: 11 fL (ref 7.5–12.5)
NEUTROS ABS: 4984 {cells}/uL (ref 1500–7800)
Neutrophils Relative %: 63.9 %
Platelets: 282 10*3/uL (ref 140–400)
RBC: 4.73 10*6/uL (ref 3.80–5.10)
RDW: 12.4 % (ref 11.0–15.0)
Total Lymphocyte: 27.1 %
WBC: 7.8 10*3/uL (ref 3.8–10.8)
WBCMIX: 569 {cells}/uL (ref 200–950)

## 2017-07-01 LAB — URINALYSIS, ROUTINE W REFLEX MICROSCOPIC
Bilirubin Urine: NEGATIVE
Glucose, UA: NEGATIVE
Hgb urine dipstick: NEGATIVE
KETONES UR: NEGATIVE
Leukocytes, UA: NEGATIVE
NITRITE: NEGATIVE
Protein, ur: NEGATIVE
SPECIFIC GRAVITY, URINE: 1.012 (ref 1.001–1.03)

## 2017-07-01 LAB — HEPATIC FUNCTION PANEL
AG RATIO: 1.8 (calc) (ref 1.0–2.5)
ALKALINE PHOSPHATASE (APISO): 66 U/L (ref 33–130)
ALT: 15 U/L (ref 6–29)
AST: 22 U/L (ref 10–35)
Albumin: 4.6 g/dL (ref 3.6–5.1)
BILIRUBIN DIRECT: 0.1 mg/dL (ref 0.0–0.2)
BILIRUBIN TOTAL: 0.5 mg/dL (ref 0.2–1.2)
GLOBULIN: 2.5 g/dL (ref 1.9–3.7)
Indirect Bilirubin: 0.4 mg/dL (calc) (ref 0.2–1.2)
TOTAL PROTEIN: 7.1 g/dL (ref 6.1–8.1)

## 2017-07-01 LAB — HEMOGLOBIN A1C
Hgb A1c MFr Bld: 5.7 % of total Hgb — ABNORMAL HIGH (ref <5.7)
Mean Plasma Glucose: 117 (calc)
eAG (mmol/L): 6.5 (calc)

## 2017-07-01 LAB — LIPID PANEL
CHOL/HDL RATIO: 3.7 (calc) (ref <5.0)
Cholesterol: 232 mg/dL — ABNORMAL HIGH (ref <200)
HDL: 63 mg/dL (ref >50)
LDL Cholesterol (Calc): 129 mg/dL (calc) — ABNORMAL HIGH
NON-HDL CHOLESTEROL (CALC): 169 mg/dL — AB (ref <130)
Triglycerides: 259 mg/dL — ABNORMAL HIGH (ref <150)

## 2017-07-01 LAB — MICROALBUMIN / CREATININE URINE RATIO: Creatinine, Urine: 51 mg/dL (ref 20–275)

## 2017-07-01 LAB — MAGNESIUM: MAGNESIUM: 2.4 mg/dL (ref 1.5–2.5)

## 2017-07-04 DIAGNOSIS — D225 Melanocytic nevi of trunk: Secondary | ICD-10-CM | POA: Diagnosis not present

## 2017-07-04 DIAGNOSIS — L814 Other melanin hyperpigmentation: Secondary | ICD-10-CM | POA: Diagnosis not present

## 2017-07-04 DIAGNOSIS — L570 Actinic keratosis: Secondary | ICD-10-CM | POA: Diagnosis not present

## 2017-07-04 DIAGNOSIS — L820 Inflamed seborrheic keratosis: Secondary | ICD-10-CM | POA: Diagnosis not present

## 2017-07-04 DIAGNOSIS — L700 Acne vulgaris: Secondary | ICD-10-CM | POA: Diagnosis not present

## 2017-07-04 DIAGNOSIS — D1801 Hemangioma of skin and subcutaneous tissue: Secondary | ICD-10-CM | POA: Diagnosis not present

## 2017-07-04 DIAGNOSIS — L821 Other seborrheic keratosis: Secondary | ICD-10-CM | POA: Diagnosis not present

## 2017-07-07 DIAGNOSIS — C73 Malignant neoplasm of thyroid gland: Secondary | ICD-10-CM | POA: Diagnosis not present

## 2017-07-07 DIAGNOSIS — E890 Postprocedural hypothyroidism: Secondary | ICD-10-CM | POA: Diagnosis not present

## 2017-07-07 DIAGNOSIS — R7301 Impaired fasting glucose: Secondary | ICD-10-CM | POA: Diagnosis not present

## 2017-09-22 DIAGNOSIS — H6123 Impacted cerumen, bilateral: Secondary | ICD-10-CM | POA: Diagnosis not present

## 2017-10-22 ENCOUNTER — Other Ambulatory Visit: Payer: Self-pay | Admitting: Internal Medicine

## 2017-12-30 ENCOUNTER — Ambulatory Visit: Payer: Self-pay | Admitting: Adult Health

## 2018-01-13 NOTE — Progress Notes (Signed)
FOLLOW UP  Assessment and Plan:   Situational hypertension At goal off of medications Monitor blood pressure at home; patient to call if consistently greater than 130/80 Continue DASH diet.   Reminder to go to the ER if any CP, SOB, nausea, dizziness, severe HA, changes vision/speech, left arm numbness and tingling and jaw pain.  Cholesterol Currently above goal; working on lifestyle Continue low cholesterol diet and exercise.  Check lipid panel.   Prediabetes Continue diet and exercise.  Perform daily foot/skin check, notify office of any concerning changes.  Check A1C  Hypothyroidism continue medications the same pending lab results reminded to take on an empty stomach 30-57mins before food.  check TSH level  BMI 21 Continue to recommend diet heavy in fruits and veggies and low in animal meats, cheeses, and dairy products, appropriate calorie intake Discuss exercise recommendations routinely Continue to monitor weight at each visit  Vitamin D Def Below goal at last visit;  She has not initated supplement Check Vit D level  Continue diet and meds as discussed. Further disposition pending results of labs. Discussed med's effects and SE's.   Over 30 minutes of exam, counseling, chart review, and critical decision making was performed.   Future Appointments  Date Time Provider [place]  07/01/2018  2:30 PM Vicie Mutters, PA-C [id number] None   ----------------------------------------------------------------------------------------------------------------------  HPI 72 y.o. female  presents for 6 month follow up on labile hypertension, cholesterol, prediabetes, weight, anxiety and vitamin D deficiency.   she has a diagnosis of anxiety and is currently on lexapro 10 mg daily, reports symptoms are well controlled on current regimen.   BMI is Body mass index is 20.98 kg/m., she has been working on diet and exercise. Wt Readings from Last 3 Encounters:   01/14/18 140 lb (63.5 kg)  06/30/17 141 lb 12.8 oz (64.3 kg)  12/23/16 136 lb 9.6 oz (62 kg)   Her blood pressure has been controlled at home, today their BP is BP: 102/60  She does workout. She denies chest pain, shortness of breath, dizziness.   She is not on cholesterol medication and denies myalgias. Her cholesterol is not at goal. The cholesterol last visit was:   Lab Results  Component Value Date   CHOL 232 (H) 06/30/2017   HDL 63 06/30/2017   LDLCALC 129 (H) 06/30/2017   TRIG 259 (H) 06/30/2017   CHOLHDL 3.7 06/30/2017    She has been working on diet and exercise for prediabetes, and denies foot ulcerations, increased appetite, nausea, paresthesia of the feet, polydipsia, polyuria, visual disturbances and vomiting. Last A1C in the office was:  Lab Results  Component Value Date   HGBA1C 5.7 (H) 06/30/2017   She is on thyroid medication. Her medication was not changed last visit.   Lab Results  Component Value Date   TSH 1.28 12/23/2016   Patient is not on Vitamin D supplement.   Lab Results  Component Value Date   VD25OH 29 (L) 03/20/2015        Current Medications:  Current Outpatient Medications on File Prior to Visit  Medication Sig  . escitalopram (LEXAPRO) 10 MG tablet TAKE 1 TABLET BY MOUTH EVERY DAY  . levothyroxine (SYNTHROID, LEVOTHROID) 50 MCG tablet TAKE 1 TABLET BY MOUTH EVERY DAY BEFORE BREAKFAST   No current facility-administered medications on file prior to visit.      Allergies: No Known Allergies   Medical History:  Past Medical History:  Diagnosis Date  . Anemia   . Anxiety   .  At high risk for altered glucose metabolism   . Cancer ([place])   . Depression   . Hypothyroidism   . RLS (restless legs syndrome)    Family history- Reviewed and unchanged Social history- Reviewed and unchanged   Review of Systems:  Review of Systems  Constitutional: Negative for malaise/fatigue and weight loss.  HENT: Negative for hearing loss and  tinnitus.   Eyes: Negative for blurred vision and double vision.  Respiratory: Negative for cough, shortness of breath and wheezing.   Cardiovascular: Negative for chest pain, palpitations, orthopnea, claudication and leg swelling.  Gastrointestinal: Negative for abdominal pain, blood in stool, constipation, diarrhea, heartburn, melena, nausea and vomiting.  Genitourinary: Negative.   Musculoskeletal: Negative for joint pain and myalgias.  Skin: Negative for rash.  Neurological: Negative for dizziness, tingling, sensory change, weakness and headaches.  Endo/Heme/Allergies: Negative for polydipsia.  Psychiatric/Behavioral: Negative.   All other systems reviewed and are negative.     Physical Exam: BP 102/60   Pulse 80   Temp (!) 97.3 F (36.3 C)   Ht 5' 8.5" (1.74 m)   Wt 140 lb (63.5 kg)   SpO2 93%   BMI 20.98 kg/m  Wt Readings from Last 3 Encounters:  01/14/18 140 lb (63.5 kg)  06/30/17 141 lb 12.8 oz (64.3 kg)  12/23/16 136 lb 9.6 oz (62 kg)   General Appearance: Well nourished, in no apparent distress. Eyes: PERRLA, EOMs, conjunctiva no swelling or erythema Sinuses: No Frontal/maxillary tenderness ENT/Mouth: Ext aud canals clear, TMs without erythema, bulging. No erythema, swelling, or exudate on post pharynx.  Tonsils not swollen or erythematous. Hearing normal.  Neck: Supple, thyroid normal.  Respiratory: Respiratory effort normal, BS equal bilaterally without rales, rhonchi, wheezing or stridor.  Cardio: RRR with no MRGs. Brisk peripheral pulses without edema.  Abdomen: Soft, + BS.  Non tender, no guarding, rebound, hernias, masses. Lymphatics: Non tender without lymphadenopathy.  Musculoskeletal: Full ROM, 5/5 strength, Normal gait Skin: Warm, dry without rashes, lesions, ecchymosis.  Neuro: Cranial nerves intact. No cerebellar symptoms.  Psych: Awake and oriented X 3, normal affect, Insight and Judgment appropriate.    Izora Ribas, NP 2:48 PM [facility]  [facility]

## 2018-01-14 ENCOUNTER — Ambulatory Visit (INDEPENDENT_AMBULATORY_CARE_PROVIDER_SITE_OTHER): Payer: Medicare Other | Admitting: Adult Health

## 2018-01-14 ENCOUNTER — Encounter: Payer: Self-pay | Admitting: Adult Health

## 2018-01-14 VITALS — BP 102/60 | HR 80 | Temp 97.3°F | Ht 68.5 in | Wt 140.0 lb

## 2018-01-14 DIAGNOSIS — F325 Major depressive disorder, single episode, in full remission: Secondary | ICD-10-CM

## 2018-01-14 DIAGNOSIS — Z79899 Other long term (current) drug therapy: Secondary | ICD-10-CM | POA: Diagnosis not present

## 2018-01-14 DIAGNOSIS — E039 Hypothyroidism, unspecified: Secondary | ICD-10-CM

## 2018-01-14 DIAGNOSIS — E559 Vitamin D deficiency, unspecified: Secondary | ICD-10-CM

## 2018-01-14 DIAGNOSIS — R030 Elevated blood-pressure reading, without diagnosis of hypertension: Secondary | ICD-10-CM

## 2018-01-14 DIAGNOSIS — R7309 Other abnormal glucose: Secondary | ICD-10-CM

## 2018-01-14 DIAGNOSIS — E782 Mixed hyperlipidemia: Secondary | ICD-10-CM

## 2018-01-14 DIAGNOSIS — F419 Anxiety disorder, unspecified: Secondary | ICD-10-CM | POA: Diagnosis not present

## 2018-01-14 NOTE — Patient Instructions (Signed)
Aim for 7+ servings of fruits and vegetables daily  80+ fluid ounces of water or unsweet tea for healthy kidneys  Limit animal fats in diet for cholesterol and heart health - choose grass fed whenever available  Aim for low stress - take time to unwind and care for your mental health  Aim for 150 min of moderate intensity exercise weekly for heart health, and weights twice weekly for bone health  Aim for 7-9 hours of sleep daily      When it comes to diets, agreement about the perfect plan isn't easy to find, even among the experts. Experts at the [employer] developed an idea known as the Healthy Eating Plate. Just imagine a plate divided into logical, healthy portions.  The emphasis is on diet quality:  Load up on vegetables and fruits - one-half of your plate: Aim for color and variety, and remember that potatoes don't count.  Go for whole grains - one-quarter of your plate: Whole wheat, barley, wheat berries, quinoa, oats, brown rice, and foods made with them. If you want pasta, go with whole wheat pasta.  Protein power - one-quarter of your plate: Fish, chicken, beans, and nuts are all healthy, versatile protein sources. Limit red meat.  The diet, however, does go beyond the plate, offering a few other suggestions.  Use healthy plant oils, such as olive, canola, soy, corn, sunflower and peanut. Check the labels, and avoid partially hydrogenated oil, which have unhealthy trans fats.  If you're thirsty, drink water. Coffee and tea are good in moderation, but skip sugary drinks and limit milk and dairy products to one or two daily servings.  The type of carbohydrate in the diet is more important than the amount. Some sources of carbohydrates, such as vegetables, fruits, whole grains, and beans-are healthier than others.  Finally, stay active.   

## 2018-01-15 LAB — COMPLETE METABOLIC PANEL WITH GFR
AG Ratio: 2.1 (calc) (ref 1.0–2.5)
ALT: 17 U/L (ref 6–29)
AST: 25 U/L (ref 10–35)
Albumin: 4.7 g/dL (ref 3.6–5.1)
Alkaline phosphatase (APISO): 62 U/L (ref 33–130)
BUN/Creatinine Ratio: 25 (calc) — ABNORMAL HIGH (ref 6–22)
BUN: 26 mg/dL — ABNORMAL HIGH (ref 7–25)
CO2: 29 mmol/L (ref 20–32)
CREATININE: 1.05 mg/dL — AB (ref 0.60–0.93)
Calcium: 9.8 mg/dL (ref 8.6–10.4)
Chloride: 103 mmol/L (ref 98–110)
GFR, Est African American: 62 mL/min/{1.73_m2} (ref >=60)
GFR, Est Non African American: 53 mL/min/{1.73_m2} — ABNORMAL LOW (ref >=60)
Globulin: 2.2 g/dL (calc) (ref 1.9–3.7)
Glucose, Bld: 94 mg/dL (ref 65–99)
Potassium: 4.3 mmol/L (ref 3.5–5.3)
SODIUM: 139 mmol/L (ref 135–146)
Total Bilirubin: 0.7 mg/dL (ref 0.2–1.2)
Total Protein: 6.9 g/dL (ref 6.1–8.1)

## 2018-01-15 LAB — CBC WITH DIFFERENTIAL/PLATELET
Basophils Absolute: 24 cells/uL (ref 0–200)
Basophils Relative: 0.3 %
EOS PCT: 1.4 %
Eosinophils Absolute: 111 cells/uL (ref 15–500)
HCT: 42.3 % (ref 35.0–45.0)
Hemoglobin: 14.7 g/dL (ref 11.7–15.5)
Lymphs Abs: 2275 cells/uL (ref 850–3900)
MCH: 30.8 pg (ref 27.0–33.0)
MCHC: 34.8 g/dL (ref 32.0–36.0)
MCV: 88.7 fL (ref 80.0–100.0)
MPV: 10.9 fL (ref 7.5–12.5)
Monocytes Relative: 7.6 %
NEUTROS PCT: 61.9 %
Neutro Abs: 4890 cells/uL (ref 1500–7800)
PLATELETS: 274 10*3/uL (ref 140–400)
RBC: 4.77 10*6/uL (ref 3.80–5.10)
RDW: 12.4 % (ref 11.0–15.0)
TOTAL LYMPHOCYTE: 28.8 %
WBC mixed population: 600 cells/uL (ref 200–950)
WBC: 7.9 10*3/uL (ref 3.8–10.8)

## 2018-01-15 LAB — LIPID PANEL
Cholesterol: 233 mg/dL — ABNORMAL HIGH (ref <200)
HDL: 62 mg/dL (ref >50)
LDL Cholesterol (Calc): 132 mg/dL (calc) — ABNORMAL HIGH
Non-HDL Cholesterol (Calc): 171 mg/dL (calc) — ABNORMAL HIGH (ref <130)
Total CHOL/HDL Ratio: 3.8 (calc) (ref <5.0)
Triglycerides: 243 mg/dL — ABNORMAL HIGH (ref <150)

## 2018-01-15 LAB — HEMOGLOBIN A1C
Hgb A1c MFr Bld: 5.9 % of total Hgb — ABNORMAL HIGH (ref <5.7)
MEAN PLASMA GLUCOSE: 123 (calc)
eAG (mmol/L): 6.8 (calc)

## 2018-01-15 LAB — VITAMIN D 25 HYDROXY (VIT D DEFICIENCY, FRACTURES): Vit D, 25-Hydroxy: 28 ng/mL — ABNORMAL LOW (ref 30–100)

## 2018-01-15 LAB — TSH: TSH: 1.42 mIU/L (ref 0.40–4.50)

## 2018-01-19 ENCOUNTER — Other Ambulatory Visit: Payer: Self-pay | Admitting: Adult Health

## 2018-04-18 ENCOUNTER — Other Ambulatory Visit: Payer: Self-pay | Admitting: Adult Health

## 2018-06-30 DIAGNOSIS — E890 Postprocedural hypothyroidism: Secondary | ICD-10-CM | POA: Diagnosis not present

## 2018-06-30 DIAGNOSIS — C73 Malignant neoplasm of thyroid gland: Secondary | ICD-10-CM | POA: Diagnosis not present

## 2018-06-30 DIAGNOSIS — R7301 Impaired fasting glucose: Secondary | ICD-10-CM | POA: Diagnosis not present

## 2018-06-30 NOTE — Progress Notes (Signed)
MEDICARE ANNUAL WELLNESS VISIT AND FOLLOW UP  Assessment:   Other abnormal glucose Discussed general issues about diabetes pathophysiology and management., Educational material distributed., Suggested low cholesterol diet., Encouraged aerobic exercise., Discussed foot care., Reminded to get yearly retinal exam.  Mixed hyperlipidemia -continue medications, check lipids, decrease fatty foods, increase activity.  - Lipid panel   Elevated BP - CBC with Differential/Platelet - Hepatic function panel - Urinalysis, Routine w reflex microscopic (not at [facility]) - Microalbumin / creatinine urine ratio - EKG 12-Lead  Hypothyroidism, unspecified hypothyroidism type Gets checked with Dr. Chalmers Cater   Anxiety Doing much better, follow up 1 year unless any changes - escitalopram (LEXAPRO) 10 MG tablet; Take 1 tablet (10 mg total) by mouth daily.  Dispense: 90 tablet; Refill: 4   Pulmonary nodules Low risk, does not need follow up and patient declines.   Medicare Wellness Has cat D breast, suggest 3D MGM  No future appointments.  Plan:   During the course of the visit the patient was educated and counseled about appropriate screening and preventive services including:    Pneumococcal vaccine   Influenza vaccine  Td vaccine  Screening electrocardiogram  Screening mammography  Bone densitometry screening  Colorectal cancer screening  Diabetes screening  Glaucoma screening  Nutrition counseling   Advanced directives: given info/requested   Subjective:   Debra Hayes is a 73 y.o. female who presents for Medicare Annual Wellness Visit and 3 month follow up on hypertension, prediabetes, hyperlipidemia, vitamin D def.   Her blood pressure has been controlled at home, today their BP is BP: 112/60 She does workout. She denies chest pain, shortness of breath, dizziness.  She has history of pulmonary lung nodules on cardiac index score but she is very low risk for lung cancer and  declines follow up due to radiology.  She is not on cholesterol medication and denies myalgias. Her cholesterol is at goal. The cholesterol last visit was:  Lab Results  Component Value Date   CHOL 233 (H) 01/14/2018   HDL 62 01/14/2018   LDLCALC 132 (H) 01/14/2018   TRIG 243 (H) 01/14/2018   CHOLHDL 3.8 01/14/2018     She has been working on diet and exercise for prediabetes, and denies paresthesia of the feet, polydipsia and polyuria.  Last A1C in the office was:  Lab Results  Component Value Date   HGBA1C 5.9 (H) 01/14/2018   Lab Results  Component Value Date   GFRNONAA 39 (L) 01/14/2018   Patient is on Vitamin D supplement, she is on 1000 every day.  Lab Results  Component Value Date   VD25OH 28 (L) 01/14/2018   She is on thyroid medication. Her medication was not changed last visit was Lab Results  Component Value Date   TSH 1.42 01/14/2018   She has anxiety/depression, started on lexapro and has done remarkable well, she was seeing a counselor but she has moved so she is now longer seeing one but is doing well at this time.   BMI is Body mass index is 20.67 kg/m., she is working on diet and exercise. Wt Readings from Last 3 Encounters:  07/01/18 140 lb (63.5 kg)  01/14/18 140 lb (63.5 kg)  06/30/17 141 lb 12.8 oz (64.3 kg)    Names of Other Physician/Practitioners you currently use: 1. [facility]- here for primary care 2. Dr. Trenton Gammon, dentist 3. Dr. Chalmers Cater 4. Dr. Sabra Heck, has appt Dec Patient Care Team: Unk Pinto, MD as PCP - General (Internal  Medicine) Rozetta Nunnery, MD as Consulting Physician (Otolaryngology) Melissa Noon, [place] as Referring Physician (Optometry) Rolm Bookbinder, MD as Consulting Physician (Dermatology) Irene Shipper, MD as Consulting Physician (Gastroenterology) Jacelyn Pi, MD as Consulting Physician (Endocrinology)  Medication Review Current Outpatient Medications on File Prior to Visit   Medication Sig Dispense Refill  . escitalopram (LEXAPRO) 10 MG tablet TAKE 1 TABLET BY MOUTH EVERY DAY 90 tablet 0  . levothyroxine (SYNTHROID, LEVOTHROID) 50 MCG tablet TAKE 1 TABLET BY MOUTH EVERY DAY BEFORE BREAKFAST 90 tablet 1   No current facility-administered medications on file prior to visit.     Current Problems (verified) Patient Active Problem List   Diagnosis Date Noted  . Elevated blood pressure, situational 03/20/2015  . Mixed hyperlipidemia 03/20/2015  . Other abnormal glucose 03/20/2015  . Vitamin D deficiency disease 03/20/2015  . Pulmonary nodules 08/23/2014  . Medication management 05/17/2014  . Anxiety   . Depression, major, in remission ([place])   . Hypothyroidism     Screening Tests Immunization History  Administered Date(s) Administered  . Td 06/30/2017  . Tdap 06/17/2006   Preventative care: Last colonoscopy: 2014 the last one, due 10 years EGD 2014 normal Last mammogram: 11/2016. CAT D, right simple cyst Last pap smear/pelvic exam: 2011 normal, never had abnormal, declines LMP age 71  DEXA: 06/2014 normal MRI head normal 2004 Thyroid US 2014 unchanged Stress test 11/2012  Prior vaccinations: TD or Tdap: 2019 Influenza: 2008, declines Pneumococcal: declines Prevnar13:  declines Shingles/Zostavax: declines  Yearly with Dr. Ubaldo Glassing Dr. Chalmers Cater for her thyroid  Allergies No Known Allergies  SURGICAL HISTORY She  has a past surgical history that includes Thyroid cyst excision and Tonsillectomy and adenoidectomy. FAMILY HISTORY Her family history includes Bladder Cancer in her mother; Heart disease (age of onset: 68) in her father; Thyroid cancer in her father. SOCIAL HISTORY She  reports that she quit smoking about 50 years ago. Her smoking use included cigarettes. She has never used smokeless tobacco. She reports current alcohol use. She reports that she does not use drugs.  MEDICARE WELLNESS OBJECTIVES: Physical activity:   Cardiac risk  factors:   Depression/mood screen:   Depression screen [facility] 2/9 06/30/2017  Decreased Interest 0  Down, Depressed, Hopeless 0  PHQ - 2 Score 0  Altered sleeping -  Tired, decreased energy -  Change in appetite -  Feeling bad or failure about yourself  -  Trouble concentrating -  Moving slowly or fidgety/restless -  Suicidal thoughts -  PHQ-9 Score -    ADLs:  No flowsheet data found.  Cognitive Testing  Alert? Yes  Normal Appearance?Yes  Oriented to person? Yes  Place? Yes   Time? Yes  Recall of three objects?  Yes  Can perform simple calculations? Yes  Displays appropriate judgment?Yes  Can read the correct time from a watch face?Yes  EOL planning: Does Patient Have a Medical Advance Directive?: Yes Type of Advance Directive: Healthcare Power of Attorney, Living will Copy of [place] in Chart?: No - copy requested  Review of Systems  Constitutional: Negative.   HENT: Negative.   Eyes: Negative.   Respiratory: Negative.   Cardiovascular: Negative.   Gastrointestinal: Negative.   Genitourinary: Negative.   Musculoskeletal: Negative.   Skin: Negative.   Neurological: Negative.   Endo/Heme/Allergies: Negative.   Psychiatric/Behavioral: Negative.     Objective:   Blood pressure 112/60, pulse 67, temperature (!) 97.5 F (36.4 C), height 5\' 9"  (1.753 m), weight 140 lb (  63.5 kg), SpO2 98 %. Body mass index is 20.67 kg/m.  General appearance: alert, no distress, WD/WN,  female HEENT: normocephalic, sclerae anicteric, TMs pearly, nares patent, no discharge or erythema, pharynx normal Oral cavity: MMM, no lesions Neck: supple, no lymphadenopathy, no thyromegaly, no masses Heart: RRR, normal S1, S2, no murmurs Lungs: CTA bilaterally, no wheezes, rhonchi, or rales Abdomen: +bs, soft, non tender, non distended, no masses, no hepatomegaly, no splenomegaly Musculoskeletal: nontender, no swelling, no obvious deformity Extremities: no edema, no cyanosis,  no clubbing Pulses: 2+ symmetric, upper and lower extremities, normal cap refill Neurological: alert, oriented x 3, CN2-12 intact, strength normal upper extremities and lower extremities, sensation normal throughout, DTRs 2+ throughout, no cerebellar signs, gait normal Psychiatric: normal affect, behavior normal, pleasant  Breast: defer Gyn: defer Rectal: defer  Medicare Attestation I have personally reviewed: The patient's medical and social history Their use of alcohol, tobacco or illicit drugs Their current medications and supplements The patient's functional ability including ADLs,fall risks, home safety risks, cognitive, and hearing and visual impairment Diet and physical activities Evidence for depression or mood disorders  The patient's weight, height, BMI, and visual acuity have been recorded in the chart.  I have made referrals, counseling, and provided education to the patient based on review of the above and I have provided the patient with a written personalized care plan for preventive services.     Vicie Mutters, PA-C   07/01/2018

## 2018-07-01 ENCOUNTER — Ambulatory Visit (INDEPENDENT_AMBULATORY_CARE_PROVIDER_SITE_OTHER): Payer: Medicare Other | Admitting: Physician Assistant

## 2018-07-01 ENCOUNTER — Encounter: Payer: Self-pay | Admitting: Physician Assistant

## 2018-07-01 VITALS — BP 112/60 | HR 67 | Temp 97.5°F | Ht 69.0 in | Wt 140.0 lb

## 2018-07-01 DIAGNOSIS — E039 Hypothyroidism, unspecified: Secondary | ICD-10-CM | POA: Diagnosis not present

## 2018-07-01 DIAGNOSIS — Z0000 Encounter for general adult medical examination without abnormal findings: Secondary | ICD-10-CM

## 2018-07-01 DIAGNOSIS — E559 Vitamin D deficiency, unspecified: Secondary | ICD-10-CM | POA: Diagnosis not present

## 2018-07-01 DIAGNOSIS — F325 Major depressive disorder, single episode, in full remission: Secondary | ICD-10-CM | POA: Diagnosis not present

## 2018-07-01 DIAGNOSIS — F419 Anxiety disorder, unspecified: Secondary | ICD-10-CM | POA: Diagnosis not present

## 2018-07-01 DIAGNOSIS — R7309 Other abnormal glucose: Secondary | ICD-10-CM | POA: Diagnosis not present

## 2018-07-01 DIAGNOSIS — Z0001 Encounter for general adult medical examination with abnormal findings: Secondary | ICD-10-CM | POA: Diagnosis not present

## 2018-07-01 DIAGNOSIS — R030 Elevated blood-pressure reading, without diagnosis of hypertension: Secondary | ICD-10-CM

## 2018-07-01 DIAGNOSIS — E782 Mixed hyperlipidemia: Secondary | ICD-10-CM

## 2018-07-01 DIAGNOSIS — Z79899 Other long term (current) drug therapy: Secondary | ICD-10-CM

## 2018-07-01 DIAGNOSIS — R918 Other nonspecific abnormal finding of lung field: Secondary | ICD-10-CM | POA: Diagnosis not present

## 2018-07-01 DIAGNOSIS — R6889 Other general symptoms and signs: Secondary | ICD-10-CM | POA: Diagnosis not present

## 2018-07-01 MED ORDER — ESCITALOPRAM OXALATE 10 MG PO TABS: 10.0000 mg | ORAL_TABLET | Freq: Every day | ORAL | 4 refills | Status: DC

## 2018-07-01 NOTE — Patient Instructions (Addendum)
HOW TO SCHEDULE A MAMMOGRAM  [facility]  7 a.m.-6:30 p.m., Monday 7 a.m.-5 p.m., Tuesday-Friday Schedule an appointment by calling [phone].  MAMMOGRAM IS DUE June 2020  Check your upper left back, scaly erythematous area- may need freezing if not better  Can read- Same kind of different   [place]  Know what a healthy weight is for you (roughly BMI <25) and aim to maintain this  Aim for 7+ servings of fruits and vegetables daily  70-80+ fluid ounces of water or unsweet tea for healthy kidneys  Limit to max 1 drink of alcohol per day; avoid smoking/tobacco  Limit animal fats in diet for cholesterol and heart health - choose grass fed whenever available  Avoid highly processed foods, and foods high in saturated/trans fats  Aim for low stress - take time to unwind and care for your mental health  Aim for 150 min of moderate intensity exercise weekly for heart health, and weights twice weekly for bone health  Aim for 7-9 hours of sleep daily

## 2018-07-02 LAB — LIPID PANEL
Cholesterol: 228 mg/dL — ABNORMAL HIGH (ref <200)
HDL: 58 mg/dL (ref >50)
LDL CHOLESTEROL (CALC): 129 mg/dL — AB
Non-HDL Cholesterol (Calc): 170 mg/dL (calc) — ABNORMAL HIGH (ref <130)
TRIGLYCERIDES: 266 mg/dL — AB (ref <150)
Total CHOL/HDL Ratio: 3.9 (calc) (ref <5.0)

## 2018-07-02 LAB — CBC WITH DIFFERENTIAL/PLATELET
ABSOLUTE MONOCYTES: 566 {cells}/uL (ref 200–950)
BASOS ABS: 33 {cells}/uL (ref 0–200)
Basophils Relative: 0.5 %
EOS ABS: 111 {cells}/uL (ref 15–500)
Eosinophils Relative: 1.7 %
HCT: 41 % (ref 35.0–45.0)
Hemoglobin: 14.3 g/dL (ref 11.7–15.5)
Lymphs Abs: 2054 cells/uL (ref 850–3900)
MCH: 31.3 pg (ref 27.0–33.0)
MCHC: 34.9 g/dL (ref 32.0–36.0)
MCV: 89.7 fL (ref 80.0–100.0)
MPV: 11.5 fL (ref 7.5–12.5)
Monocytes Relative: 8.7 %
NEUTROS PCT: 57.5 %
Neutro Abs: 3738 cells/uL (ref 1500–7800)
Platelets: 280 10*3/uL (ref 140–400)
RBC: 4.57 10*6/uL (ref 3.80–5.10)
RDW: 12.2 % (ref 11.0–15.0)
Total Lymphocyte: 31.6 %
WBC: 6.5 10*3/uL (ref 3.8–10.8)

## 2018-07-02 LAB — URINALYSIS, ROUTINE W REFLEX MICROSCOPIC
Bilirubin Urine: NEGATIVE
Glucose, UA: NEGATIVE
Hgb urine dipstick: NEGATIVE
Ketones, ur: NEGATIVE
Leukocytes, UA: NEGATIVE
Nitrite: NEGATIVE
Protein, ur: NEGATIVE
Specific Gravity, Urine: 1.015 (ref 1.001–1.03)

## 2018-07-02 LAB — MAGNESIUM: Magnesium: 2.2 mg/dL (ref 1.5–2.5)

## 2018-07-02 LAB — COMPLETE METABOLIC PANEL WITH GFR
AG Ratio: 2 (calc) (ref 1.0–2.5)
ALBUMIN MSPROF: 4.4 g/dL (ref 3.6–5.1)
ALT: 15 U/L (ref 6–29)
AST: 23 U/L (ref 10–35)
Alkaline phosphatase (APISO): 72 U/L (ref 33–130)
BILIRUBIN TOTAL: 0.4 mg/dL (ref 0.2–1.2)
BUN / CREAT RATIO: 15 (calc) (ref 6–22)
BUN: 17 mg/dL (ref 7–25)
CHLORIDE: 105 mmol/L (ref 98–110)
CO2: 25 mmol/L (ref 20–32)
Calcium: 9.3 mg/dL (ref 8.6–10.4)
Creat: 1.11 mg/dL — ABNORMAL HIGH (ref 0.60–0.93)
GFR, EST AFRICAN AMERICAN: 57 mL/min/{1.73_m2} — AB (ref >=60)
GFR, EST NON AFRICAN AMERICAN: 50 mL/min/{1.73_m2} — AB (ref >=60)
GLUCOSE: 90 mg/dL (ref 65–99)
Globulin: 2.2 g/dL (calc) (ref 1.9–3.7)
Potassium: 4.2 mmol/L (ref 3.5–5.3)
Sodium: 137 mmol/L (ref 135–146)
TOTAL PROTEIN: 6.6 g/dL (ref 6.1–8.1)

## 2018-07-02 LAB — HEMOGLOBIN A1C
EAG (MMOL/L): 6.3 (calc)
Hgb A1c MFr Bld: 5.6 % of total Hgb (ref <5.7)
Mean Plasma Glucose: 114 (calc)

## 2018-07-02 LAB — VITAMIN D 25 HYDROXY (VIT D DEFICIENCY, FRACTURES): Vit D, 25-Hydroxy: 53 ng/mL (ref 30–100)

## 2018-07-02 LAB — MICROALBUMIN / CREATININE URINE RATIO
Creatinine, Urine: 103 mg/dL (ref 20–275)
Microalb Creat Ratio: 3 mcg/mg creat (ref <30)
Microalb, Ur: 0.3 mg/dL

## 2018-07-07 DIAGNOSIS — E890 Postprocedural hypothyroidism: Secondary | ICD-10-CM | POA: Diagnosis not present

## 2018-07-07 DIAGNOSIS — C73 Malignant neoplasm of thyroid gland: Secondary | ICD-10-CM | POA: Diagnosis not present

## 2018-07-07 DIAGNOSIS — R7301 Impaired fasting glucose: Secondary | ICD-10-CM | POA: Diagnosis not present

## 2018-12-30 ENCOUNTER — Ambulatory Visit: Payer: Self-pay | Admitting: Physician Assistant

## 2018-12-31 DIAGNOSIS — L814 Other melanin hyperpigmentation: Secondary | ICD-10-CM | POA: Diagnosis not present

## 2018-12-31 DIAGNOSIS — L819 Disorder of pigmentation, unspecified: Secondary | ICD-10-CM | POA: Diagnosis not present

## 2018-12-31 DIAGNOSIS — L812 Freckles: Secondary | ICD-10-CM | POA: Diagnosis not present

## 2018-12-31 DIAGNOSIS — D225 Melanocytic nevi of trunk: Secondary | ICD-10-CM | POA: Diagnosis not present

## 2018-12-31 DIAGNOSIS — L821 Other seborrheic keratosis: Secondary | ICD-10-CM | POA: Diagnosis not present

## 2018-12-31 DIAGNOSIS — D1801 Hemangioma of skin and subcutaneous tissue: Secondary | ICD-10-CM | POA: Diagnosis not present

## 2018-12-31 DIAGNOSIS — Z419 Encounter for procedure for purposes other than remedying health state, unspecified: Secondary | ICD-10-CM | POA: Diagnosis not present

## 2019-07-13 ENCOUNTER — Ambulatory Visit: Payer: Self-pay | Admitting: Physician Assistant

## 2019-07-13 ENCOUNTER — Ambulatory Visit: Payer: Self-pay | Admitting: Adult Health Nurse Practitioner

## 2019-07-14 ENCOUNTER — Ambulatory Visit: Payer: Medicare Other

## 2019-07-21 NOTE — Progress Notes (Signed)
MEDICARE ANNUAL WELLNESS VISIT AND FOLLOW UP  Assessment:   Other abnormal glucose Discussed general issues about diabetes pathophysiology and management., Educational material distributed., Suggested low cholesterol diet., Encouraged aerobic exercise., Discussed foot care., Reminded to get yearly retinal exam.  Mixed hyperlipidemia -continue medications, check lipids, decrease fatty foods, increase activity.  - Lipid panel   Elevated BP - CBC with Differential/Platelet - Hepatic function panel - Urinalysis, Routine w reflex microscopic (not at [facility]) - Microalbumin / creatinine urine ratio - defer EKG this year, no symptoms  Hypothyroidism, unspecified hypothyroidism type Will check here this year due to the pandemic   Anxiety Doing much better, follow up 1 year unless any changes - escitalopram (LEXAPRO) 10 MG tablet; Take 1 tablet (10 mg total) by mouth daily.  Dispense: 90 tablet; Refill: 4   Pulmonary nodules Low risk, does not need follow up and patient declines.   Medicare Wellness Has cat D breast, suggest 3D MGM  Future Appointments  Date Time Provider [place]  07/24/2019  9:15 AM [place] [facility]  11/11/2019 10:00 AM Vicie Mutters, PA-C [facility] None  07/21/2020 10:30 AM Vicie Mutters, PA-C [facility] None    Plan:   During the course of the visit the patient was educated and counseled about appropriate screening and preventive services including:    Pneumococcal vaccine   Influenza vaccine  Td vaccine  Screening electrocardiogram  Screening mammography  Bone densitometry screening  Colorectal cancer screening  Diabetes screening  Glaucoma screening  Nutrition counseling   Advanced directives: given info/requested   Subjective:   Debra Hayes is a 74 y.o. female who presents for Medicare Annual Wellness Visit and 3 month follow up on hypertension, prediabetes, hyperlipidemia, vitamin D def.   BMI is  Body mass index is 21.19 kg/m., she is working on diet and exercise. Wt Readings from Last 3 Encounters:  07/22/19 141 lb 6.4 oz (64.1 kg)  07/01/18 140 lb (63.5 kg)  01/14/18 140 lb (63.5 kg)   Her blood pressure has been controlled at home, today their BP is BP: 114/70 She does workout. She denies chest pain, shortness of breath, dizziness.  She has history of pulmonary lung nodules on cardiac index score but she is very low risk for lung cancer, so with shared decision making she has not been retested.   She is not on cholesterol medication and denies myalgias. Her cholesterol is at goal. The cholesterol last visit was:  Lab Results  Component Value Date   CHOL 228 (H) 07/01/2018   HDL 58 07/01/2018   LDLCALC 129 (H) 07/01/2018   TRIG 266 (H) 07/01/2018   CHOLHDL 3.9 07/01/2018     She has been working on diet and exercise for prediabetes, and denies paresthesia of the feet, polydipsia and polyuria.  Last A1C in the office was:  Lab Results  Component Value Date   HGBA1C 5.6 07/01/2018   Lab Results  Component Value Date   GFRNONAA 50 (L) 07/01/2018   Patient is on Vitamin D supplement, she is on 2000-4000 every day, varies.  Lab Results  Component Value Date   VD25OH 53 07/01/2018   She is on thyroid medication, 1 tablet 6 x a week. Her medication was not changed last visit, she is not on a biotin. Lab Results  Component Value Date   TSH 1.42 01/14/2018   She has anxiety/depression, started on lexapro 10 and has done remarkable well, she was seeing a counselor but she has moved  so she is now longer seeing one but is doing well at this time.    Names of Other Physician/Practitioners you currently use: 1. [facility]- here for primary care 2. Dr. Trenton Gammon, dentist 3. Dr. Chalmers Cater 4. Dr. Sabra Heck, has appt Dec Patient Care Team: Unk Pinto, MD as PCP - General (Internal Medicine) Rozetta Nunnery, MD as Consulting Physician  (Otolaryngology) Melissa Noon, [place] as Referring Physician (Optometry) Rolm Bookbinder, MD as Consulting Physician (Dermatology) Irene Shipper, MD as Consulting Physician (Gastroenterology) Jacelyn Pi, MD as Consulting Physician (Endocrinology)  Medication Review No current outpatient medications on file prior to visit.   No current facility-administered medications on file prior to visit.    Current Problems (verified) Patient Active Problem List   Diagnosis Date Noted  . Elevated blood pressure, situational 03/20/2015  . Mixed hyperlipidemia 03/20/2015  . Other abnormal glucose 03/20/2015  . Vitamin D deficiency disease 03/20/2015  . Pulmonary nodules 08/23/2014  . Medication management 05/17/2014  . Anxiety   . Depression, major, in remission ([place])   . Hypothyroidism     Screening Tests Immunization History  Administered Date(s) Administered  . Td 06/30/2017  . Tdap 06/17/2006   Preventative care: Last colonoscopy: 2014 the last one, due 10 years EGD 2014 normal Last mammogram: 11/2016. CAT D, right simple cyst Last pap smear/pelvic exam: 2011 normal, never had abnormal, declines LMP age 31  DEXA: 06/2014 normal MRI head normal 2004 Thyroid US 2014 unchanged Stress test 11/2012  Prior vaccinations: TD or Tdap: 2019 Influenza: 2008, declines Pneumococcal: declines Prevnar13:  declines Shingles/Zostavax: declines COVID this saturday  Yearly with Dr. Ubaldo Glassing Dr. Chalmers Cater for her thyroid  Allergies No Known Allergies  SURGICAL HISTORY She  has a past surgical history that includes Thyroid cyst excision and Tonsillectomy and adenoidectomy. FAMILY HISTORY Her family history includes Bladder Cancer in her mother; Heart disease (age of onset: 39) in her father; Thyroid cancer in her father. SOCIAL HISTORY She  reports that she quit smoking about 51 years ago. Her smoking use included cigarettes. She has never used smokeless tobacco. She reports current alcohol use.  She reports that she does not use drugs.  MEDICARE WELLNESS OBJECTIVES: Physical activity: Current Exercise Habits: Home exercise routine, Type of exercise: walking, Time (Minutes): 30, Frequency (Times/Week): 4, Weekly Exercise (Minutes/Week): 120, Intensity: Mild Cardiac risk factors: Cardiac Risk Factors include: dyslipidemia;hypertension;advanced age (>56men, >26 women) Depression/mood screen:   Depression screen [facility] 2/9 07/22/2019  Decreased Interest 0  Down, Depressed, Hopeless 0  PHQ - 2 Score 0  Altered sleeping -  Tired, decreased energy -  Change in appetite -  Feeling bad or failure about yourself  -  Trouble concentrating -  Moving slowly or fidgety/restless -  Suicidal thoughts -  PHQ-9 Score -    ADLs:  In your present state of health, do you have any difficulty performing the following activities: 07/22/2019  Hearing? N  Vision? N  Difficulty concentrating or making decisions? N  Walking or climbing stairs? N  Dressing or bathing? N  Doing errands, shopping? N  Some recent data might be hidden    Cognitive Testing  Alert? Yes  Normal Appearance?Yes  Oriented to person? Yes  Place? Yes   Time? Yes  Recall of three objects?  Yes  Can perform simple calculations? Yes  Displays appropriate judgment?Yes  Can read the correct time from a watch face?Yes  EOL planning: Does Patient Have a Medical Advance Directive?: Yes Type of  Advance Directive: Healthcare Power of Attorney, Living will Does patient want to make changes to medical advance directive?: No - Patient declined Copy of [place] in Chart?: No - copy requested  Review of Systems  Constitutional: Negative.   HENT: Negative.   Eyes: Negative.   Respiratory: Negative.   Cardiovascular: Negative.   Gastrointestinal: Negative.   Genitourinary: Negative.   Musculoskeletal: Negative.   Skin: Negative.   Neurological: Negative.   Endo/Heme/Allergies: Negative.   Psychiatric/Behavioral:  Negative.     Objective:   Blood pressure 114/70, pulse 83, temperature (!) 97.3 F (36.3 C), height 5' 8.5" (1.74 m), weight 141 lb 6.4 oz (64.1 kg), SpO2 97 %. Body mass index is 21.19 kg/m.  General appearance: alert, no distress, WD/WN,  female HEENT: normocephalic, sclerae anicteric, TMs pearly, Mouth and nose not examined- patient wearing a facemask Neck: supple, no lymphadenopathy, no thyromegaly, no masses Heart: RRR, normal S1, S2, no murmurs Lungs: CTA bilaterally, no wheezes, rhonchi, or rales Abdomen: +bs, soft, non tender, non distended, no masses, no hepatomegaly, no splenomegaly Musculoskeletal: nontender, no swelling, no obvious deformity Extremities: no edema, no cyanosis, no clubbing Pulses: 2+ symmetric, upper and lower extremities, normal cap refill Neurological: alert, oriented x 3, CN2-12 intact, strength normal upper extremities and lower extremities, sensation normal throughout, DTRs 2+ throughout, no cerebellar signs, gait normal Psychiatric: normal affect, behavior normal, pleasant  Breast: defer Gyn: defer Rectal: defer  Medicare Attestation I have personally reviewed: The patient's medical and social history Their use of alcohol, tobacco or illicit drugs Their current medications and supplements The patient's functional ability including ADLs,fall risks, home safety risks, cognitive, and hearing and visual impairment Diet and physical activities Evidence for depression or mood disorders  The patient's weight, height, BMI, and visual acuity have been recorded in the chart.  I have made referrals, counseling, and provided education to the patient based on review of the above and I have provided the patient with a written personalized care plan for preventive services.     Vicie Mutters, PA-C   07/22/2019

## 2019-07-22 ENCOUNTER — Ambulatory Visit: Payer: Self-pay | Admitting: Physician Assistant

## 2019-07-22 ENCOUNTER — Encounter: Payer: Self-pay | Admitting: Physician Assistant

## 2019-07-22 ENCOUNTER — Ambulatory Visit (INDEPENDENT_AMBULATORY_CARE_PROVIDER_SITE_OTHER): Payer: PPO | Admitting: Physician Assistant

## 2019-07-22 ENCOUNTER — Other Ambulatory Visit: Payer: Self-pay

## 2019-07-22 VITALS — BP 114/70 | HR 83 | Temp 97.3°F | Ht 68.5 in | Wt 141.4 lb

## 2019-07-22 DIAGNOSIS — F325 Major depressive disorder, single episode, in full remission: Secondary | ICD-10-CM

## 2019-07-22 DIAGNOSIS — E782 Mixed hyperlipidemia: Secondary | ICD-10-CM

## 2019-07-22 DIAGNOSIS — R030 Elevated blood-pressure reading, without diagnosis of hypertension: Secondary | ICD-10-CM | POA: Diagnosis not present

## 2019-07-22 DIAGNOSIS — E538 Deficiency of other specified B group vitamins: Secondary | ICD-10-CM

## 2019-07-22 DIAGNOSIS — R918 Other nonspecific abnormal finding of lung field: Secondary | ICD-10-CM

## 2019-07-22 DIAGNOSIS — Z0000 Encounter for general adult medical examination without abnormal findings: Secondary | ICD-10-CM

## 2019-07-22 DIAGNOSIS — E559 Vitamin D deficiency, unspecified: Secondary | ICD-10-CM

## 2019-07-22 DIAGNOSIS — F419 Anxiety disorder, unspecified: Secondary | ICD-10-CM

## 2019-07-22 DIAGNOSIS — R7309 Other abnormal glucose: Secondary | ICD-10-CM | POA: Diagnosis not present

## 2019-07-22 DIAGNOSIS — D649 Anemia, unspecified: Secondary | ICD-10-CM

## 2019-07-22 DIAGNOSIS — E039 Hypothyroidism, unspecified: Secondary | ICD-10-CM | POA: Diagnosis not present

## 2019-07-22 DIAGNOSIS — R6889 Other general symptoms and signs: Secondary | ICD-10-CM | POA: Diagnosis not present

## 2019-07-22 DIAGNOSIS — Z79899 Other long term (current) drug therapy: Secondary | ICD-10-CM | POA: Diagnosis not present

## 2019-07-22 DIAGNOSIS — Z0001 Encounter for general adult medical examination with abnormal findings: Secondary | ICD-10-CM

## 2019-07-22 MED ORDER — LEVOTHYROXINE SODIUM 50 MCG PO TABS: ORAL_TABLET | ORAL | 3 refills | Status: AC

## 2019-07-22 MED ORDER — ESCITALOPRAM OXALATE 10 MG PO TABS: 10.0000 mg | ORAL_TABLET | Freq: Every day | ORAL | 4 refills | Status: DC

## 2019-07-22 NOTE — Patient Instructions (Addendum)
HOW TO SCHEDULE A MAMMOGRAM  [facility]  7 a.m.-6:30 p.m., Monday 7 a.m.-5 p.m., Tuesday-Friday Schedule an appointment by calling [phone].   Please bring in your health care power of attorney, living will so we can scan it into the system.    If I told you I had a single pill that would help you with everything listed below and more, would you be interested? . decrease stress by improving anxiety and depression . help you achieve a healthy weight . give you more energy . make you more productive . help you focus . decrease your risk of dementia/heart attack/stroke/falls . improve your bone health  These are just some of the benefits that exercise brings to you.   IT IS WORTH carving out some time every day to fit in exercise. It will help in every aspect of your health. Even if you have injuries that prevent you from participating in a type of exercise you used to do; there is always something that you can do to keep exercise a part of your life. If improving your health is important, make exercise your priority. It is worth the time! If you have questions about the type of exercise that is right for you, please talk with me about this!  EXERCISE IS MEDICINE!  Benefits of Exercise  Reduces breast cancer onset and recurrence by 50% Lowers risk of colon cancer by 66% Reduces the risk of Alzheimer's by almost 50% Reduces heart disease and high blood pressure by almost 50% Lowers risk of stroke by 33% Lowers risk of Type II Diabetes Mellitus by over 60% Treats depression as well as medication or cognitive behavioral therapy       Exercising to Stay Healthy  Exercising regularly is important. It has many health benefits, such as:  Improving your overall fitness, flexibility, and endurance.  Increasing your bone density.  Helping with weight control.  Decreasing your body fat.  Increasing your muscle strength.  Reducing stress and  tension.  Improving your overall health.   In order to become healthy and stay healthy, it is recommended that you do moderate-intensity and vigorous-intensity exercise. You can tell that you are exercising at a moderate intensity if you have a higher heart rate and faster breathing, but you are still able to hold a conversation. You can tell that you are exercising at a vigorous intensity if you are breathing much harder and faster and cannot hold a conversation while exercising. How often should I exercise? Choose an activity that you enjoy and set realistic goals. Your health care provider can help you to make an activity plan that works for you. Exercise regularly as directed by your health care provider. This may include:  Doing resistance training twice each week, such as: ? Push-ups. ? Sit-ups. ? Lifting weights. ? Using resistance bands.  Doing a given intensity of exercise for a given amount of time. Choose from these options: ? 150 minutes of moderate-intensity exercise every week. ? 75 minutes of vigorous-intensity exercise every week. ? A mix of moderate-intensity and vigorous-intensity exercise every week.   Children, pregnant women, people who are out of shape, people who are overweight, and older adults may need to consult a health care provider for individual recommendations. If you have any sort of medical condition, be sure to consult your health care provider before starting a new exercise program. What are some exercise ideas? Some moderate-intensity exercise ideas include:  Walking at a rate  of 1 mile in 15 minutes.  Biking.  Hiking.  Golfing.  Dancing.   Some vigorous-intensity exercise ideas include:  Walking at a rate of at least 4.5 miles per hour.  Jogging or running at a rate of 5 miles per hour.  Biking at a rate of at least 10 miles per hour.  Lap swimming.  Roller-skating or in-line skating.  Cross-country skiing.  Vigorous competitive  sports, such as football, basketball, and soccer.  Jumping rope.  Aerobic dancing.   What are some everyday activities that can help me to get exercise?  [place] work, such as: ? Pushing a Conservation officer, nature. ? Raking and bagging leaves.  Washing and waxing your car.  Pushing a stroller.  Shoveling snow.  Gardening.  Washing windows or floors. How can I be more active in my day-to-day activities?  Use the stairs instead of the elevator.  Take a walk during your lunch break.  If you drive, park your car farther away from work or school.  If you take public transportation, get off one stop early and walk the rest of the way.  Make all of your phone calls while standing up and walking around.  Get up, stretch, and walk around every 30 minutes throughout the day. What guidelines should I follow while exercising?  Do not exercise so much that you hurt yourself, feel dizzy, or get very short of breath.  Consult your health care provider before starting a new exercise program.  Wear comfortable clothes and shoes with good support.  Drink plenty of water while you exercise to prevent dehydration or heat stroke. Body water is lost during exercise and must be replaced.  Work out until you breathe faster and your heart beats faster. This information is not intended to replace advice given to you by your health care provider. Make sure you discuss any questions you have with your health care provider.

## 2019-07-23 LAB — URINALYSIS, ROUTINE W REFLEX MICROSCOPIC
Bilirubin Urine: NEGATIVE
Glucose, UA: NEGATIVE
Hgb urine dipstick: NEGATIVE
Ketones, ur: NEGATIVE
Leukocytes,Ua: NEGATIVE
Nitrite: NEGATIVE
Protein, ur: NEGATIVE
Specific Gravity, Urine: 1.01 (ref 1.001–1.03)
pH: <=5 (ref 5.0–8.0)

## 2019-07-23 LAB — COMPLETE METABOLIC PANEL WITH GFR
AG Ratio: 2 (calc) (ref 1.0–2.5)
ALT: 23 U/L (ref 6–29)
AST: 23 U/L (ref 10–35)
Albumin: 4.6 g/dL (ref 3.6–5.1)
Alkaline phosphatase (APISO): 70 U/L (ref 37–153)
BUN: 13 mg/dL (ref 7–25)
CO2: 23 mmol/L (ref 20–32)
Calcium: 9.5 mg/dL (ref 8.6–10.4)
Chloride: 106 mmol/L (ref 98–110)
Creat: 0.87 mg/dL (ref 0.60–0.93)
GFR, Est African American: 77 mL/min/{1.73_m2} (ref >=60)
GFR, Est Non African American: 66 mL/min/{1.73_m2} (ref >=60)
Globulin: 2.3 g/dL (calc) (ref 1.9–3.7)
Glucose, Bld: 97 mg/dL (ref 65–99)
Potassium: 4.5 mmol/L (ref 3.5–5.3)
Sodium: 140 mmol/L (ref 135–146)
Total Bilirubin: 0.5 mg/dL (ref 0.2–1.2)
Total Protein: 6.9 g/dL (ref 6.1–8.1)

## 2019-07-23 LAB — CBC WITH DIFFERENTIAL/PLATELET
Absolute Monocytes: 416 cells/uL (ref 200–950)
Basophils Absolute: 31 cells/uL (ref 0–200)
Basophils Relative: 0.6 %
Eosinophils Absolute: 88 cells/uL (ref 15–500)
Eosinophils Relative: 1.7 %
HCT: 43.7 % (ref 35.0–45.0)
Hemoglobin: 15 g/dL (ref 11.7–15.5)
Lymphs Abs: 1706 cells/uL (ref 850–3900)
MCH: 30.7 pg (ref 27.0–33.0)
MCHC: 34.3 g/dL (ref 32.0–36.0)
MCV: 89.5 fL (ref 80.0–100.0)
MPV: 11.3 fL (ref 7.5–12.5)
Monocytes Relative: 8 %
Neutro Abs: 2959 cells/uL (ref 1500–7800)
Neutrophils Relative %: 56.9 %
Platelets: 286 10*3/uL (ref 140–400)
RBC: 4.88 10*6/uL (ref 3.80–5.10)
RDW: 12.2 % (ref 11.0–15.0)
Total Lymphocyte: 32.8 %
WBC: 5.2 10*3/uL (ref 3.8–10.8)

## 2019-07-23 LAB — MICROALBUMIN / CREATININE URINE RATIO
Creatinine, Urine: 60 mg/dL (ref 20–275)
Microalb Creat Ratio: 3 mcg/mg creat (ref <30)
Microalb, Ur: 0.2 mg/dL

## 2019-07-23 LAB — HEMOGLOBIN A1C
Hgb A1c MFr Bld: 5.8 % of total Hgb — ABNORMAL HIGH (ref <5.7)
Mean Plasma Glucose: 120 (calc)
eAG (mmol/L): 6.6 (calc)

## 2019-07-23 LAB — LIPID PANEL
Cholesterol: 240 mg/dL — ABNORMAL HIGH (ref <200)
HDL: 58 mg/dL (ref >=50)
LDL Cholesterol (Calc): 142 mg/dL (calc) — ABNORMAL HIGH
Non-HDL Cholesterol (Calc): 182 mg/dL (calc) — ABNORMAL HIGH (ref <130)
Total CHOL/HDL Ratio: 4.1 (calc) (ref <5.0)
Triglycerides: 263 mg/dL — ABNORMAL HIGH (ref <150)

## 2019-07-23 LAB — VITAMIN D 25 HYDROXY (VIT D DEFICIENCY, FRACTURES): Vit D, 25-Hydroxy: 38 ng/mL (ref 30–100)

## 2019-07-23 LAB — MAGNESIUM: Magnesium: 2.3 mg/dL (ref 1.5–2.5)

## 2019-07-23 LAB — TSH: TSH: 1.26 mIU/L (ref 0.40–4.50)

## 2019-07-24 ENCOUNTER — Ambulatory Visit: Payer: PPO | Attending: Internal Medicine

## 2019-07-24 DIAGNOSIS — Z23 Encounter for immunization: Secondary | ICD-10-CM | POA: Insufficient documentation

## 2019-07-24 NOTE — Progress Notes (Signed)
   Covid-19 Vaccination Clinic  Name:  Debra Hayes    MRN: [id number] DOB: 20-Oct-1945  07/24/2019  [name] was observed post Covid-19 immunization for 15 minutes without incidence. She was provided with Vaccine Information Sheet and instruction to access the V-Safe system.   [name] was instructed to call 911 with any severe reactions post vaccine: Marland Kitchen Difficulty breathing  . Swelling of your face and throat  . A fast heartbeat  . A bad rash all over your body  . Dizziness and weakness    Immunizations Administered    Name Date Dose VIS Date Route   Pfizer COVID-19 Vaccine 07/24/2019  9:24 AM 0.3 mL 05/28/2019 Intramuscular   Manufacturer: [place]   Lot: [id number]   [place]: [id number]

## 2019-07-31 ENCOUNTER — Ambulatory Visit: Payer: Medicare Other

## 2019-08-18 ENCOUNTER — Ambulatory Visit: Payer: PPO | Attending: Internal Medicine

## 2019-08-18 DIAGNOSIS — Z23 Encounter for immunization: Secondary | ICD-10-CM

## 2019-08-18 NOTE — Progress Notes (Signed)
   Covid-19 Vaccination Clinic  Name:  Debra Hayes    MRN: [id number] DOB: 1945-12-14  08/18/2019  Debra Hayes was observed post Covid-19 immunization for 15 minutes without incident. She was provided with Vaccine Information Sheet and instruction to access the V-Safe system.   Debra Hayes was instructed to call 911 with any severe reactions post vaccine: Marland Kitchen Difficulty breathing  . Swelling of face and throat  . A fast heartbeat  . A bad rash all over body  . Dizziness and weakness   Immunizations Administered    Name Date Dose VIS Date Route   Pfizer COVID-19 Vaccine 08/18/2019  9:41 AM 0.3 mL 05/28/2019 Intramuscular   Manufacturer: [place]   Lot: [id number]   [place]: [id number]

## 2019-10-18 DIAGNOSIS — E890 Postprocedural hypothyroidism: Secondary | ICD-10-CM | POA: Diagnosis not present

## 2019-10-18 DIAGNOSIS — R7301 Impaired fasting glucose: Secondary | ICD-10-CM | POA: Diagnosis not present

## 2019-10-18 DIAGNOSIS — C73 Malignant neoplasm of thyroid gland: Secondary | ICD-10-CM | POA: Diagnosis not present

## 2019-11-09 DIAGNOSIS — E890 Postprocedural hypothyroidism: Secondary | ICD-10-CM | POA: Diagnosis not present

## 2019-11-09 DIAGNOSIS — C73 Malignant neoplasm of thyroid gland: Secondary | ICD-10-CM | POA: Diagnosis not present

## 2019-11-09 DIAGNOSIS — R7301 Impaired fasting glucose: Secondary | ICD-10-CM | POA: Diagnosis not present

## 2019-11-11 ENCOUNTER — Ambulatory Visit: Payer: PPO | Admitting: Physician Assistant

## 2019-11-30 DIAGNOSIS — H5203 Hypermetropia, bilateral: Secondary | ICD-10-CM | POA: Diagnosis not present

## 2019-11-30 DIAGNOSIS — H25813 Combined forms of age-related cataract, bilateral: Secondary | ICD-10-CM | POA: Diagnosis not present

## 2019-11-30 DIAGNOSIS — H52223 Regular astigmatism, bilateral: Secondary | ICD-10-CM | POA: Diagnosis not present

## 2019-11-30 DIAGNOSIS — H40213 Acute angle-closure glaucoma, bilateral: Secondary | ICD-10-CM | POA: Diagnosis not present

## 2019-11-30 DIAGNOSIS — H524 Presbyopia: Secondary | ICD-10-CM | POA: Diagnosis not present

## 2019-12-21 ENCOUNTER — Other Ambulatory Visit: Payer: Self-pay

## 2019-12-21 ENCOUNTER — Ambulatory Visit (INDEPENDENT_AMBULATORY_CARE_PROVIDER_SITE_OTHER): Payer: PPO | Admitting: Otolaryngology

## 2019-12-21 VITALS — Temp 98.1°F

## 2019-12-21 DIAGNOSIS — H6123 Impacted cerumen, bilateral: Secondary | ICD-10-CM

## 2019-12-21 NOTE — Progress Notes (Signed)
HPI: Debra Hayes is a 74 y.o. female who presents for evaluation of wax buildup in her ears.  She was last seen a couple years ago.  She has narrow ear canals bilaterally..  Past Medical History:  Diagnosis Date  . Anemia   . Anxiety   . At high risk for altered glucose metabolism   . Cancer ([place])   . Depression   . Hypothyroidism   . RLS (restless legs syndrome)    Past Surgical History:  Procedure Laterality Date  . THYROID CYST EXCISION    . TONSILLECTOMY AND ADENOIDECTOMY     Social History   Socioeconomic History  . Marital status: Single    Spouse name: Not on file  . Number of children: 0  . Years of education: Not on file  . Highest education level: Not on file  Occupational History  . Not on file  Tobacco Use  . Smoking status: Former Smoker    Types: Cigarettes    Quit date: 06/17/1968    Years since quitting: 51.5  . Smokeless tobacco: Never Used  . Tobacco comment: Smoked 40 years ago.  Substance and Sexual Activity  . Alcohol use: Yes    Alcohol/week: 0.0 standard drinks    Comment: very little  . Drug use: No  . Sexual activity: Not on file  Other Topics Concern  . Not on file  Social History Narrative   Lives alone.  She has a cat.     Social Determinants of Health   Financial Resource Strain:   . Difficulty of Paying Living Expenses:   Food Insecurity:   . Worried About Charity fundraiser in the Last Year:   . Arboriculturist in the Last Year:   Transportation Needs:   . Film/video editor (Medical):   Marland Kitchen Lack of Transportation (Non-Medical):   Physical Activity:   . Days of Exercise per Week:   . Minutes of Exercise per Session:   Stress:   . Feeling of Stress :   Social Connections:   . Frequency of Communication with Friends and Family:   . Frequency of Social Gatherings with Friends and Family:   . Attends Religious Services:   . Active Member of Clubs or Organizations:   . Attends Archivist Meetings:   Marland Kitchen Marital  Status:    Family History  Problem Relation Age of Onset  . Heart disease Father 50       2 heart attacks  . Thyroid cancer Father   . Bladder Cancer Mother    No Known Allergies Prior to Admission medications   Medication Sig Start Date End Date Taking? Authorizing Provider  escitalopram (LEXAPRO) 10 MG tablet Take 1 tablet (10 mg total) by mouth daily. 07/22/19  Yes Vicie Mutters, PA-C  levothyroxine (SYNTHROID) 50 MCG tablet TAKE 1 TABLET BY MOUTH EVERY DAY BEFORE BREAKFAST 07/22/19  Yes Vicie Mutters, PA-C     Positive ROS: Otherwise negative  All other systems have been reviewed and were otherwise negative with the exception of those mentioned in the HPI and as above.  Physical Exam: Constitutional: Alert, well-appearing, no acute distress Ears: External ears without lesions or tenderness. Ear canals small ear canals bilaterally with moderate wax buildup that was cleaned with curettes.  TMs were otherwise clear.. Nasal: External nose without lesions. Clear nasal passages Oral: Oropharynx clear. Neck: No palpable adenopathy or masses Respiratory: Breathing comfortably  Skin: No facial/neck lesions or rash noted.  Cerumen impaction  removal  Date/Time: 12/21/2019 3:08 PM Performed by: Rozetta Nunnery, MD Authorized by: Rozetta Nunnery, MD   Consent:    Consent obtained:  Verbal   Consent given by:  Patient   Risks discussed:  Pain and bleeding Procedure details:    Location:  L ear and R ear   Procedure type: curette   Post-procedure details:    Inspection:  TM intact and canal normal   Hearing quality:  Improved   Patient tolerance of procedure:  Tolerated well, no immediate complications Comments:     TMs are clear bilaterally.    Assessment: Bilateral cerumen buildup  Plan: This was cleaned in the office.  She will follow-up as needed.  Radene Journey, MD

## 2020-01-04 ENCOUNTER — Encounter: Payer: Medicare Other | Admitting: Physician Assistant

## 2020-01-05 ENCOUNTER — Other Ambulatory Visit: Payer: Self-pay | Admitting: Internal Medicine

## 2020-01-05 DIAGNOSIS — Z1231 Encounter for screening mammogram for malignant neoplasm of breast: Secondary | ICD-10-CM

## 2020-01-19 ENCOUNTER — Ambulatory Visit
Admission: RE | Admit: 2020-01-19 | Discharge: 2020-01-19 | Disposition: A | Discharge status: Home / Self Care | Payer: PPO | Source: Ambulatory Visit | Attending: Internal Medicine | Admitting: Internal Medicine

## 2020-01-19 ENCOUNTER — Other Ambulatory Visit: Payer: Self-pay

## 2020-01-19 DIAGNOSIS — Z1231 Encounter for screening mammogram for malignant neoplasm of breast: Secondary | ICD-10-CM | POA: Diagnosis not present

## 2020-01-26 ENCOUNTER — Ambulatory Visit: Payer: PPO | Admitting: Physician Assistant

## 2020-01-26 NOTE — Progress Notes (Signed)
CPE AND FOLLOW UP  Assessment:   Other abnormal glucose Discussed general issues about diabetes pathophysiology and management., Educational material distributed., Suggested low cholesterol diet., Encouraged aerobic exercise., Discussed foot care., Reminded to get yearly retinal exam.  Mixed hyperlipidemia -continue medications, check lipids, decrease fatty foods, increase activity.  - Lipid panel   Elevated BP - CBC with Differential/Platelet - Hepatic function panel - Urinalysis, Routine w reflex microscopic (not at [facility]) - Microalbumin / creatinine urine ratio  Hypothyroidism, unspecified hypothyroidism type Will check here this year due to the pandemic   Anxiety Doing much better, follow up 6 months for wellness unless any changes - escitalopram (LEXAPRO) 10 MG tablet; Take 1 tablet (10 mg total) by mouth daily.  Dispense: 90 tablet; Refill: 4   Pulmonary nodules Low risk, patient declines follow up  CPE Has cat D breast, suggest 3D MGM  Future Appointments  Date Time Provider [place]  07/21/2020 10:30 AM Vicie Mutters, PA-C [id number] None     Subjective:   Debra Hayes is a 74 y.o. female who presents for CPE and 6 month follow up on hypertension, prediabetes, hyperlipidemia, vitamin D def.   BMI is Body mass index is 21.08 kg/m., she is working on diet and exercise, she has been back at the gym, eating 3 x a day, trying to eat better.  Wt Readings from Last 3 Encounters:  01/27/20 136 lb 9.6 oz (62 kg)  07/22/19 141 lb 6.4 oz (64.1 kg)  07/01/18 140 lb (63.5 kg)   Her blood pressure has been controlled at home, today their BP is BP: (!) 100/58  BP Readings from Last 3 Encounters:  01/27/20 (!) 100/58  07/22/19 114/70  07/01/18 112/60   She does workout. She denies chest pain, shortness of breath, dizziness.  She has history of pulmonary lung nodules on cardiac index score but she is very low risk for lung cancer, so with shared decision making  she has not been retested.    She is not on cholesterol medication. She had a negative coronary calcium score of 0 in 2016. Marland Kitchen Her cholesterol is not at goal. The cholesterol last visit was:  Lab Results  Component Value Date   CHOL 240 (H) 07/22/2019   HDL 58 07/22/2019   LDLCALC 142 (H) 07/22/2019   TRIG 263 (H) 07/22/2019   CHOLHDL 4.1 07/22/2019     She has been working on diet and exercise for prediabetes, and denies paresthesia of the feet, polydipsia and polyuria.  Last A1C in the office was:  Lab Results  Component Value Date   HGBA1C 5.8 (H) 07/22/2019   Lab Results  Component Value Date   GFRNONAA 66 07/22/2019   Patient is on Vitamin D supplement, she is on 4000 every day, varies.  Lab Results  Component Value Date   VD25OH 38 07/22/2019   She is on thyroid medication, 1 tablet 6 x a week. Her medication was not changed last visit, she is not on a biotin. She recently followed up with Dr. Chalmers Cater.  Lab Results  Component Value Date   TSH 1.26 07/22/2019   She has anxiety/depression, started on lexapro 10 and has done remarkable well, she was seeing a counselor but she has moved so she is now longer seeing one but is doing well at this time.  Her Sister jennifer.   Names of Other Physician/Practitioners you currently use: 1. [facility]- here for primary care 2. Dr. Trenton Gammon, dentist 3.  Dr. Chalmers Cater 4. Dr. Sabra Heck, has appt Dec Patient Care Team: Unk Pinto, MD as PCP - General (Internal Medicine) Rozetta Nunnery, MD as Consulting Physician (Otolaryngology) Melissa Noon, [place] as Referring Physician (Optometry) Rolm Bookbinder, MD as Consulting Physician (Dermatology) Irene Shipper, MD as Consulting Physician (Gastroenterology) Jacelyn Pi, MD as Consulting Physician (Endocrinology)  Medication Review Current Outpatient Medications on File Prior to Visit  Medication Sig Dispense Refill  . escitalopram (LEXAPRO) 10 MG  tablet Take 1 tablet (10 mg total) by mouth daily. 90 tablet 4  . levothyroxine (SYNTHROID) 50 MCG tablet TAKE 1 TABLET BY MOUTH EVERY DAY BEFORE BREAKFAST 90 tablet 3   No current facility-administered medications on file prior to visit.    Current Problems (verified) Patient Active Problem List   Diagnosis Date Noted  . Elevated blood pressure, situational 03/20/2015  . Mixed hyperlipidemia 03/20/2015  . Other abnormal glucose 03/20/2015  . Vitamin D deficiency disease 03/20/2015  . Pulmonary nodules 08/23/2014  . Medication management 05/17/2014  . Anxiety   . Depression, major, in remission ([place])   . Hypothyroidism     Screening Tests Immunization History  Administered Date(s) Administered  . PFIZER SARS-COV-2 Vaccination 07/24/2019, 08/18/2019  . Td 06/30/2017  . Tdap 06/17/2006   Health Maintenance  Topic Date Due  . Hepatitis C Screening  Never done  . PNA vac Low Risk Adult (1 of 2 - PCV13) Never done  . INFLUENZA VACCINE  01/16/2020  . MAMMOGRAM  01/18/2022  . COLONOSCOPY  10/09/2022  . TETANUS/TDAP  07/01/2027  . DEXA SCAN  Completed  . COVID-19 Vaccine  Completed    Preventative care: Last colonoscopy: 2014 the last one EGD 2014 normal Last mammogram: 01/2020 CAT D, right simple cyst Last pap smear/pelvic exam: 2011 normal, never had abnormal, declines LMP age 88  DEXA: 06/2014 normal MRI head normal 2004 Thyroid US 2014 unchanged Stress test 11/2012  Prior vaccinations: TD or Tdap: 2019 Influenza: 2008, declines Pneumococcal:  Prevnar13:  declines Shingles/Zostavax: declines COVID this saturday  Yearly with Dr. Ubaldo Glassing Dr. Chalmers Cater for her thyroid  Allergies No Known Allergies  SURGICAL HISTORY She  has a past surgical history that includes Thyroid cyst excision and Tonsillectomy and adenoidectomy. FAMILY HISTORY Her family history includes Bladder Cancer in her mother; Heart disease (age of onset: 14) in her father; Thyroid cancer in her  father. SOCIAL HISTORY She  reports that she quit smoking about 51 years ago. Her smoking use included cigarettes. She has never used smokeless tobacco. She reports current alcohol use. She reports that she does not use drugs.  Review of Systems  Constitutional: Negative.   HENT: Negative.   Eyes: Negative.   Respiratory: Negative.   Cardiovascular: Negative.   Gastrointestinal: Negative.   Genitourinary: Negative.   Musculoskeletal: Negative.   Skin: Negative.   Neurological: Negative.   Endo/Heme/Allergies: Negative.   Psychiatric/Behavioral: Negative.     Objective:   Blood pressure (!) 100/58, pulse (!) 43, temperature 97.9 F (36.6 C), height 5' 7.5" (1.715 m), weight 136 lb 9.6 oz (62 kg), SpO2 98 %. Body mass index is 21.08 kg/m.  General appearance: alert, no distress, WD/WN,  female HEENT: normocephalic, sclerae anicteric, TMs pearly, Mouth and nose not examined- patient wearing a facemask Neck: supple, no lymphadenopathy, no thyromegaly, no masses Heart: RRR with occ PVCs, normal S1, S2, no murmurs Lungs: CTA bilaterally, no wheezes, rhonchi, or rales Abdomen: +bs, soft, non tender, non distended, no masses, no hepatomegaly, no splenomegaly Musculoskeletal:  nontender, no swelling, no obvious deformity Extremities: no edema, no cyanosis, no clubbing Pulses: 2+ symmetric, upper and lower extremities, normal cap refill Neurological: alert, oriented x 3, CN2-12 intact, strength normal upper extremities and lower extremities, sensation normal throughout, DTRs 2+ throughout, no cerebellar signs, gait normal Psychiatric: normal affect, behavior normal, pleasant   EKG: Sinus rhythm with PACs, IRBBB, no ST changes from last EKG   Vicie Mutters, PA-C   01/27/2020

## 2020-01-27 ENCOUNTER — Encounter: Payer: Self-pay | Admitting: Physician Assistant

## 2020-01-27 ENCOUNTER — Other Ambulatory Visit: Payer: Self-pay

## 2020-01-27 ENCOUNTER — Ambulatory Visit (INDEPENDENT_AMBULATORY_CARE_PROVIDER_SITE_OTHER): Payer: PPO | Admitting: Physician Assistant

## 2020-01-27 VITALS — BP 100/58 | HR 43 | Temp 97.9°F | Ht 67.5 in | Wt 136.6 lb

## 2020-01-27 DIAGNOSIS — F419 Anxiety disorder, unspecified: Secondary | ICD-10-CM

## 2020-01-27 DIAGNOSIS — Z0000 Encounter for general adult medical examination without abnormal findings: Secondary | ICD-10-CM | POA: Diagnosis not present

## 2020-01-27 DIAGNOSIS — Z79899 Other long term (current) drug therapy: Secondary | ICD-10-CM | POA: Diagnosis not present

## 2020-01-27 DIAGNOSIS — Z0001 Encounter for general adult medical examination with abnormal findings: Secondary | ICD-10-CM

## 2020-01-27 DIAGNOSIS — E782 Mixed hyperlipidemia: Secondary | ICD-10-CM

## 2020-01-27 DIAGNOSIS — R030 Elevated blood-pressure reading, without diagnosis of hypertension: Secondary | ICD-10-CM

## 2020-01-27 DIAGNOSIS — F325 Major depressive disorder, single episode, in full remission: Secondary | ICD-10-CM

## 2020-01-27 DIAGNOSIS — R918 Other nonspecific abnormal finding of lung field: Secondary | ICD-10-CM

## 2020-01-27 DIAGNOSIS — Z136 Encounter for screening for cardiovascular disorders: Secondary | ICD-10-CM | POA: Diagnosis not present

## 2020-01-27 DIAGNOSIS — R7309 Other abnormal glucose: Secondary | ICD-10-CM

## 2020-01-27 DIAGNOSIS — E039 Hypothyroidism, unspecified: Secondary | ICD-10-CM

## 2020-01-27 DIAGNOSIS — E559 Vitamin D deficiency, unspecified: Secondary | ICD-10-CM

## 2020-01-28 LAB — URINALYSIS, ROUTINE W REFLEX MICROSCOPIC
Bilirubin Urine: NEGATIVE
Glucose, UA: NEGATIVE
Hgb urine dipstick: NEGATIVE
Ketones, ur: NEGATIVE
Leukocytes,Ua: NEGATIVE
Nitrite: NEGATIVE
Protein, ur: NEGATIVE
Specific Gravity, Urine: 1.023 (ref 1.001–1.03)
pH: <=5 (ref 5.0–8.0)

## 2020-01-28 LAB — CBC WITH DIFFERENTIAL/PLATELET
Absolute Monocytes: 676 cells/uL (ref 200–950)
Basophils Absolute: 52 cells/uL (ref 0–200)
Basophils Relative: 0.8 %
Eosinophils Absolute: 98 cells/uL (ref 15–500)
Eosinophils Relative: 1.5 %
HCT: 41.6 % (ref 35.0–45.0)
Hemoglobin: 14.2 g/dL (ref 11.7–15.5)
Lymphs Abs: 1983 cells/uL (ref 850–3900)
MCH: 31.1 pg (ref 27.0–33.0)
MCHC: 34.1 g/dL (ref 32.0–36.0)
MCV: 91.2 fL (ref 80.0–100.0)
MPV: 11.2 fL (ref 7.5–12.5)
Monocytes Relative: 10.4 %
Neutro Abs: 3692 cells/uL (ref 1500–7800)
Neutrophils Relative %: 56.8 %
Platelets: 269 10*3/uL (ref 140–400)
RBC: 4.56 10*6/uL (ref 3.80–5.10)
RDW: 12.4 % (ref 11.0–15.0)
Total Lymphocyte: 30.5 %
WBC: 6.5 10*3/uL (ref 3.8–10.8)

## 2020-01-28 LAB — COMPLETE METABOLIC PANEL WITH GFR
AG Ratio: 2.1 (calc) (ref 1.0–2.5)
ALT: 16 U/L (ref 6–29)
AST: 23 U/L (ref 10–35)
Albumin: 4.5 g/dL (ref 3.6–5.1)
Alkaline phosphatase (APISO): 61 U/L (ref 37–153)
BUN/Creatinine Ratio: 20 (calc) (ref 6–22)
BUN: 19 mg/dL (ref 7–25)
CO2: 26 mmol/L (ref 20–32)
Calcium: 9.4 mg/dL (ref 8.6–10.4)
Chloride: 106 mmol/L (ref 98–110)
Creat: 0.97 mg/dL — ABNORMAL HIGH (ref 0.60–0.93)
GFR, Est African American: 67 mL/min/{1.73_m2} (ref >=60)
GFR, Est Non African American: 58 mL/min/{1.73_m2} — ABNORMAL LOW (ref >=60)
Globulin: 2.1 g/dL (calc) (ref 1.9–3.7)
Glucose, Bld: 98 mg/dL (ref 65–99)
Potassium: 4.4 mmol/L (ref 3.5–5.3)
Sodium: 140 mmol/L (ref 135–146)
Total Bilirubin: 0.6 mg/dL (ref 0.2–1.2)
Total Protein: 6.6 g/dL (ref 6.1–8.1)

## 2020-01-28 LAB — LIPID PANEL
Cholesterol: 218 mg/dL — ABNORMAL HIGH (ref <200)
HDL: 59 mg/dL (ref >=50)
LDL Cholesterol (Calc): 126 mg/dL (calc) — ABNORMAL HIGH
Non-HDL Cholesterol (Calc): 159 mg/dL (calc) — ABNORMAL HIGH (ref <130)
Total CHOL/HDL Ratio: 3.7 (calc) (ref <5.0)
Triglycerides: 214 mg/dL — ABNORMAL HIGH (ref <150)

## 2020-01-28 LAB — MICROALBUMIN / CREATININE URINE RATIO
Creatinine, Urine: 157 mg/dL (ref 20–275)
Microalb Creat Ratio: 1 mcg/mg creat (ref <30)
Microalb, Ur: 0.2 mg/dL

## 2020-01-28 LAB — MAGNESIUM: Magnesium: 2.4 mg/dL (ref 1.5–2.5)

## 2020-02-29 DIAGNOSIS — L821 Other seborrheic keratosis: Secondary | ICD-10-CM | POA: Diagnosis not present

## 2020-02-29 DIAGNOSIS — D692 Other nonthrombocytopenic purpura: Secondary | ICD-10-CM | POA: Diagnosis not present

## 2020-02-29 DIAGNOSIS — D1801 Hemangioma of skin and subcutaneous tissue: Secondary | ICD-10-CM | POA: Diagnosis not present

## 2020-02-29 DIAGNOSIS — L814 Other melanin hyperpigmentation: Secondary | ICD-10-CM | POA: Diagnosis not present

## 2020-02-29 DIAGNOSIS — Z419 Encounter for procedure for purposes other than remedying health state, unspecified: Secondary | ICD-10-CM | POA: Diagnosis not present

## 2020-02-29 DIAGNOSIS — L570 Actinic keratosis: Secondary | ICD-10-CM | POA: Diagnosis not present

## 2020-04-29 ENCOUNTER — Ambulatory Visit: Payer: PPO | Attending: Internal Medicine

## 2020-04-29 DIAGNOSIS — Z23 Encounter for immunization: Secondary | ICD-10-CM

## 2020-04-29 NOTE — Progress Notes (Signed)
   Covid-19 Vaccination Clinic  Name:  Chevella Pearce    MRN: [id number] DOB: April 23, 1946  04/29/2020  Ms. Batie was observed post Covid-19 immunization for 15 minutes without incident. She was provided with Vaccine Information Sheet and instruction to access the V-Safe system.   Ms. Vore was instructed to call 911 with any severe reactions post vaccine: Marland Kitchen Difficulty breathing  . Swelling of face and throat  . A fast heartbeat  . A bad rash all over body  . Dizziness and weakness

## 2020-07-21 ENCOUNTER — Encounter: Payer: BC Managed Care – PPO | Admitting: Physician Assistant

## 2020-08-01 ENCOUNTER — Other Ambulatory Visit: Payer: Self-pay

## 2020-08-01 ENCOUNTER — Encounter: Payer: Self-pay | Admitting: Adult Health Nurse Practitioner

## 2020-08-01 ENCOUNTER — Ambulatory Visit (INDEPENDENT_AMBULATORY_CARE_PROVIDER_SITE_OTHER): Payer: PPO | Admitting: Adult Health Nurse Practitioner

## 2020-08-01 VITALS — BP 110/80 | HR 62 | Temp 97.7°F | Ht 67.5 in | Wt 138.0 lb

## 2020-08-01 DIAGNOSIS — E039 Hypothyroidism, unspecified: Secondary | ICD-10-CM | POA: Diagnosis not present

## 2020-08-01 DIAGNOSIS — R030 Elevated blood-pressure reading, without diagnosis of hypertension: Secondary | ICD-10-CM

## 2020-08-01 DIAGNOSIS — Z0001 Encounter for general adult medical examination with abnormal findings: Secondary | ICD-10-CM | POA: Diagnosis not present

## 2020-08-01 DIAGNOSIS — R7309 Other abnormal glucose: Secondary | ICD-10-CM | POA: Diagnosis not present

## 2020-08-01 DIAGNOSIS — F325 Major depressive disorder, single episode, in full remission: Secondary | ICD-10-CM

## 2020-08-01 DIAGNOSIS — Z79899 Other long term (current) drug therapy: Secondary | ICD-10-CM

## 2020-08-01 DIAGNOSIS — E538 Deficiency of other specified B group vitamins: Secondary | ICD-10-CM

## 2020-08-01 DIAGNOSIS — E559 Vitamin D deficiency, unspecified: Secondary | ICD-10-CM | POA: Diagnosis not present

## 2020-08-01 DIAGNOSIS — R6889 Other general symptoms and signs: Secondary | ICD-10-CM

## 2020-08-01 DIAGNOSIS — E782 Mixed hyperlipidemia: Secondary | ICD-10-CM

## 2020-08-01 DIAGNOSIS — D649 Anemia, unspecified: Secondary | ICD-10-CM

## 2020-08-01 DIAGNOSIS — R918 Other nonspecific abnormal finding of lung field: Secondary | ICD-10-CM

## 2020-08-01 DIAGNOSIS — Z0000 Encounter for general adult medical examination without abnormal findings: Secondary | ICD-10-CM

## 2020-08-01 DIAGNOSIS — F419 Anxiety disorder, unspecified: Secondary | ICD-10-CM

## 2020-08-01 MED ORDER — ESCITALOPRAM OXALATE 10 MG PO TABS: 10.0000 mg | ORAL_TABLET | Freq: Every day | ORAL | 4 refills | Status: DC

## 2020-08-01 NOTE — Progress Notes (Signed)
MEDICARE ANNUAL WELLNESS VISIT AND FOLLOW UP  Assessment:   Debra Hayes was seen today for follow-up and medicare wellness.  Diagnoses and all orders for this visit:  Encounter for Medicare annual wellness exam Yearly  Elevated blood pressure, situational Lifestyle controlled Monitor blood pressure at home; call if consistently over 130/80 Continue DASH diet.   Reminder to go to the ER if any CP, SOB, nausea, dizziness, severe HA, changes vision/speech, left arm numbness and tingling and jaw pain.  Mixed hyperlipidemia Lifestyle controlled Discussed dietary and exercise modifications Low fat diet  Hypothyroidism, unspecified type Taking levothyroxine 50 mcg daily Reminder to take on an empty stomach 30-67mins before first meal of the day. No antacid medications for 4 hours.  Depression, major, in remission ([place]) -     escitalopram (LEXAPRO) 10 MG tablet; Take 1 tablet (10 mg total) by mouth daily.  Anxiety Doing well on current regimen Continue escitalopram 10mg  daily Discussed stress management techniques   Discussed good sleep hygiene Discussed increasing physical activity and exercise Increase water intake  Other abnormal glucose Discussed dietary and exercise modifications  Vitamin D deficiency disease Continue supplementation to maintain goal of 70-100 -Vit D  Pulmonary nodules Low risk, does not need follow up and patient declines.   Anemia, unspecified type No supplementation Asymptomatic Check yearly at CPE  B12 deficiency No supplementation Check yearly at CPE  Medication management Continued -     CBC with Differential/Platelet -     COMPLETE METABOLIC PANEL WITH GFR    Mail hard copy to patient, Call.    Future Appointments  Date Time Provider [place]  08/01/2021 11:00 AM Garnet Sierras, NP [facility] None    Plan:   During the course of the visit the patient was educated and counseled about appropriate screening and  preventive services including:    Pneumococcal vaccine   Influenza vaccine  Td vaccine  Screening electrocardiogram  Screening mammography  Bone densitometry screening  Colorectal cancer screening  Diabetes screening  Glaucoma screening  Nutrition counseling   Advanced directives: given info/requested   Subjective:   Debra Hayes is a 75 y.o. female who presents for Medicare Annual Wellness Visit and 3 month follow up on prediabetes, hyperlipidemia, vitamin D def.   BMI is Body mass index is 21.29 kg/m., she is working on diet and exercise. Wt Readings from Last 3 Encounters:  08/01/20 138 lb (62.6 kg)  01/27/20 136 lb 9.6 oz (62 kg)  07/22/19 141 lb 6.4 oz (64.1 kg)   She has had elevated blood pressure in the past but not treatment, lifestyle controlled. Her blood pressure has been controlled at home, today their BP is BP: 110/80 She does workout. She denies chest pain, shortness of breath, dizziness.  She has history of pulmonary lung nodules on cardiac index score but she is very low risk for lung cancer, so with shared decision making she has not been retested.   She is not on cholesterol medication and denies myalgias. Her cholesterol is at goal. The cholesterol last visit was:  Lab Results  Component Value Date   CHOL 218 (H) 01/27/2020   HDL 59 01/27/2020   LDLCALC 126 (H) 01/27/2020   TRIG 214 (H) 01/27/2020   CHOLHDL 3.7 01/27/2020     She has been working on diet and exercise for prediabetes, and denies paresthesia of the feet, polydipsia and polyuria.  Last A1C in the office was:  Lab Results  Component Value Date   HGBA1C 5.8 (H) 07/22/2019  Lab Results  Component Value Date   CZYSAYTK 16 (L) 01/27/2020   Patient is on Vitamin D supplement, she is on 2000-4000 every day, varies.  Lab Results  Component Value Date   VD25OH 38 07/22/2019   She is on thyroid medication, 1 tablet 6 x a week. Her medication was not changed last visit, she is  not on a biotin.  She has follow up in 09/2020. Lab Results  Component Value Date   TSH 1.26 07/22/2019   She has anxiety/depression, started on lexapro 10 and has done remarkable well, she was seeing a counselor but she has moved so she is now longer seeing one but is doing well at this time.    Names of Other Physician/Practitioners you currently use: 1. [facility]- here for primary care 2. Dr. Trenton Gammon, dentist 3. Dr. Chalmers Cater 4. Dr. Sabra Heck, has appt Dec Patient Care Team: Unk Pinto, MD as PCP - General (Internal Medicine) Rozetta Nunnery, MD as Consulting Physician (Otolaryngology) Melissa Noon, [place] as Referring Physician (Optometry) Rolm Bookbinder, MD as Consulting Physician (Dermatology) Irene Shipper, MD as Consulting Physician (Gastroenterology) Jacelyn Pi, MD as Consulting Physician (Endocrinology)  Medication Review Current Outpatient Medications on File Prior to Visit  Medication Sig Dispense Refill  . escitalopram (LEXAPRO) 10 MG tablet Take 1 tablet (10 mg total) by mouth daily. 90 tablet 4  . levothyroxine (SYNTHROID) 50 MCG tablet TAKE 1 TABLET BY MOUTH EVERY DAY BEFORE BREAKFAST 90 tablet 3   No current facility-administered medications on file prior to visit.    Current Problems (verified) Patient Active Problem List   Diagnosis Date Noted  . Elevated blood pressure, situational 03/20/2015  . Mixed hyperlipidemia 03/20/2015  . Other abnormal glucose 03/20/2015  . Vitamin D deficiency disease 03/20/2015  . Pulmonary nodules 08/23/2014  . Medication management 05/17/2014  . Anxiety   . Depression, major, in remission ([place])   . Hypothyroidism     Screening Tests Immunization History  Administered Date(s) Administered  . PFIZER(Purple Top)SARS-COV-2 Vaccination 07/24/2019, 08/18/2019, 04/29/2020  . Td 06/30/2017  . Tdap 06/17/2006   Preventative care: Last colonoscopy: 2014 the last one, due 10 years EGD  2014 normal Last mammogram: 01/2017. CAT D, right simple cyst Last pap smear/pelvic exam: 2011 normal, never had abnormal, declines LMP age 76  DEXA: 06/2014 normal, Complete MRI head normal 2004 Thyroid US 2014 unchanged Stress test 11/2012  Prior vaccinations: TD or Tdap: 2019 Influenza: 2008, declines Pneumococcal: declines Prevnar13:  declines Shingles/Zostavax: declines COVID Completed  Yearly with Dr. Ubaldo Glassing Dr. Chalmers Cater for her thyroid  Allergies No Known Allergies  SURGICAL HISTORY She  has a past surgical history that includes Thyroid cyst excision and Tonsillectomy and adenoidectomy. FAMILY HISTORY Her family history includes Bladder Cancer in her mother; Heart disease (age of onset: 36) in her father; Thyroid cancer in her father. SOCIAL HISTORY She  reports that she quit smoking about 52 years ago. Her smoking use included cigarettes. She has never used smokeless tobacco. She reports current alcohol use. She reports that she does not use drugs.  MEDICARE WELLNESS OBJECTIVES: Physical activity:   Cardiac risk factors:   Depression/mood screen:   Depression screen [facility] 2/9 07/22/2019  Decreased Interest 0  Down, Depressed, Hopeless 0  PHQ - 2 Score 0  Altered sleeping -  Tired, decreased energy -  Change in appetite -  Feeling bad or failure about yourself  -  Trouble concentrating -  Moving slowly or fidgety/restless -  Suicidal thoughts -  PHQ-9 Score -    ADLs:  No flowsheet data found.  Cognitive Testing  Alert? Yes  Normal Appearance?Yes  Oriented to person? Yes  Place? Yes   Time? Yes  Recall of three objects?  Yes  Can perform simple calculations? Yes  Displays appropriate judgment?Yes  Can read the correct time from a watch face?Yes  EOL planning: Does Patient Have a Medical Advance Directive?: Yes Type of Advance Directive: [place] will [place] in Chart?: No - copy requested  Review of  Systems  Constitutional: Negative.   HENT: Negative.   Eyes: Negative.   Respiratory: Negative.   Cardiovascular: Negative.   Gastrointestinal: Negative.   Genitourinary: Negative.   Musculoskeletal: Negative.   Skin: Negative.   Neurological: Negative.   Endo/Heme/Allergies: Negative.   Psychiatric/Behavioral: Negative.     Objective:   Blood pressure 110/80, pulse 62, temperature 97.7 F (36.5 C), height 5' 7.5" (1.715 m), weight 138 lb (62.6 kg), SpO2 98 %. Body mass index is 21.29 kg/m.  General appearance: alert, no distress, WD/WN,  female HEENT: normocephalic, sclerae anicteric, TMs pearly, Mouth and nose not examined- patient wearing a facemask Neck: supple, no lymphadenopathy, no thyromegaly, no masses Heart: RRR, normal S1, S2, no murmurs Lungs: CTA bilaterally, no wheezes, rhonchi, or rales Abdomen: +bs, soft, non tender, non distended, no masses, no hepatomegaly, no splenomegaly Musculoskeletal: nontender, no swelling, no obvious deformity Extremities: no edema, no cyanosis, no clubbing Pulses: 2+ symmetric, upper and lower extremities, normal cap refill Neurological: alert, oriented x 3, CN2-12 intact, strength normal upper extremities and lower extremities, sensation normal throughout, DTRs 2+ throughout, no cerebellar signs, gait normal Psychiatric: normal affect, behavior normal, pleasant  Breast: defer Gyn: defer Rectal: defer  Medicare Attestation I have personally reviewed: The patient's medical and social history Their use of alcohol, tobacco or illicit drugs Their current medications and supplements The patient's functional ability including ADLs,fall risks, home safety risks, cognitive, and hearing and visual impairment Diet and physical activities Evidence for depression or mood disorders  The patient's weight, height, BMI, and visual acuity have been recorded in the chart.  I have made referrals, counseling, and provided education to the patient  based on review of the above and I have provided the patient with a written personalized care plan for preventive services.     Garnet Sierras, NP   08/01/2020

## 2020-08-02 LAB — COMPLETE METABOLIC PANEL WITH GFR
AG Ratio: 2.3 (calc) (ref 1.0–2.5)
ALT: 19 U/L (ref 6–29)
AST: 23 U/L (ref 10–35)
Albumin: 4.8 g/dL (ref 3.6–5.1)
Alkaline phosphatase (APISO): 71 U/L (ref 37–153)
BUN: 16 mg/dL (ref 7–25)
CO2: 27 mmol/L (ref 20–32)
Calcium: 9.5 mg/dL (ref 8.6–10.4)
Chloride: 105 mmol/L (ref 98–110)
Creat: 0.82 mg/dL (ref 0.60–0.93)
GFR, Est African American: 82 mL/min/{1.73_m2} (ref >=60)
GFR, Est Non African American: 70 mL/min/{1.73_m2} (ref >=60)
Globulin: 2.1 g/dL (calc) (ref 1.9–3.7)
Glucose, Bld: 111 mg/dL — ABNORMAL HIGH (ref 65–99)
Potassium: 4.3 mmol/L (ref 3.5–5.3)
Sodium: 140 mmol/L (ref 135–146)
Total Bilirubin: 0.6 mg/dL (ref 0.2–1.2)
Total Protein: 6.9 g/dL (ref 6.1–8.1)

## 2020-08-02 LAB — CBC WITH DIFFERENTIAL/PLATELET
Absolute Monocytes: 540 cells/uL (ref 200–950)
Basophils Absolute: 40 cells/uL (ref 0–200)
Basophils Relative: 0.8 %
Eosinophils Absolute: 60 cells/uL (ref 15–500)
Eosinophils Relative: 1.2 %
HCT: 43.5 % (ref 35.0–45.0)
Hemoglobin: 14.9 g/dL (ref 11.7–15.5)
Lymphs Abs: 1745 cells/uL (ref 850–3900)
MCH: 31.2 pg (ref 27.0–33.0)
MCHC: 34.3 g/dL (ref 32.0–36.0)
MCV: 91 fL (ref 80.0–100.0)
MPV: 10.6 fL (ref 7.5–12.5)
Monocytes Relative: 10.8 %
Neutro Abs: 2615 cells/uL (ref 1500–7800)
Neutrophils Relative %: 52.3 %
Platelets: 285 10*3/uL (ref 140–400)
RBC: 4.78 10*6/uL (ref 3.80–5.10)
RDW: 12.1 % (ref 11.0–15.0)
Total Lymphocyte: 34.9 %
WBC: 5 10*3/uL (ref 3.8–10.8)

## 2020-08-23 ENCOUNTER — Other Ambulatory Visit: Payer: PPO

## 2020-08-24 ENCOUNTER — Other Ambulatory Visit: Payer: PPO

## 2020-08-24 DIAGNOSIS — Z20822 Contact with and (suspected) exposure to covid-19: Secondary | ICD-10-CM

## 2020-08-25 LAB — SARS-COV-2, NAA 2 DAY TAT

## 2020-08-25 LAB — NOVEL CORONAVIRUS, NAA: SARS-CoV-2, NAA: NOT DETECTED

## 2020-09-25 ENCOUNTER — Ambulatory Visit: Payer: PPO | Attending: Internal Medicine

## 2020-09-25 ENCOUNTER — Ambulatory Visit: Payer: PPO

## 2020-09-25 ENCOUNTER — Other Ambulatory Visit: Payer: Self-pay

## 2020-09-25 DIAGNOSIS — Z23 Encounter for immunization: Secondary | ICD-10-CM

## 2020-09-25 NOTE — Progress Notes (Signed)
   Covid-19 Vaccination Clinic  Name:  Debra Hayes    MRN: [id number] DOB: Jan 11, 1946  09/25/2020  [name] was observed post Covid-19 immunization for 15 minutes without incident. She was provided with Vaccine Information Sheet and instruction to access the V-Safe system.   [name] was instructed to call 911 with any severe reactions post vaccine: Marland Kitchen Difficulty breathing  . Swelling of face and throat  . A fast heartbeat  . A bad rash all over body  . Dizziness and weakness   Immunizations Administered    Name Date Dose VIS Date Route   PFIZER Comrnaty(Gray TOP) Covid-19 Vaccine 09/25/2020 12:29 PM 0.3 mL 05/25/2020 Intramuscular   Manufacturer: [place]   Lot: [id number]   NDC: [phone]

## 2020-10-13 ENCOUNTER — Other Ambulatory Visit (HOSPITAL_BASED_OUTPATIENT_CLINIC_OR_DEPARTMENT_OTHER): Payer: Self-pay

## 2020-10-13 MED ORDER — PFIZER-BIONT COVID-19 VAC-TRIS 30 MCG/0.3ML IM SUSP
INTRAMUSCULAR | 0 refills | Status: DC
  Filled 2020-10-13: qty 0.3, 1d supply, fill #0

## 2020-10-31 DIAGNOSIS — R7301 Impaired fasting glucose: Secondary | ICD-10-CM | POA: Diagnosis not present

## 2020-10-31 DIAGNOSIS — C73 Malignant neoplasm of thyroid gland: Secondary | ICD-10-CM | POA: Diagnosis not present

## 2020-10-31 DIAGNOSIS — E890 Postprocedural hypothyroidism: Secondary | ICD-10-CM | POA: Diagnosis not present

## 2020-11-07 DIAGNOSIS — C73 Malignant neoplasm of thyroid gland: Secondary | ICD-10-CM | POA: Diagnosis not present

## 2020-11-07 DIAGNOSIS — R7301 Impaired fasting glucose: Secondary | ICD-10-CM | POA: Diagnosis not present

## 2020-11-07 DIAGNOSIS — E890 Postprocedural hypothyroidism: Secondary | ICD-10-CM | POA: Diagnosis not present

## 2021-01-30 ENCOUNTER — Other Ambulatory Visit: Payer: Self-pay

## 2021-01-30 ENCOUNTER — Ambulatory Visit (INDEPENDENT_AMBULATORY_CARE_PROVIDER_SITE_OTHER): Payer: PPO | Admitting: Adult Health

## 2021-01-30 ENCOUNTER — Encounter: Payer: Self-pay | Admitting: Adult Health

## 2021-01-30 VITALS — BP 112/70 | HR 76 | Temp 97.8°F | Resp 16 | Ht 67.5 in | Wt 141.4 lb

## 2021-01-30 DIAGNOSIS — Z1389 Encounter for screening for other disorder: Secondary | ICD-10-CM | POA: Diagnosis not present

## 2021-01-30 DIAGNOSIS — R030 Elevated blood-pressure reading, without diagnosis of hypertension: Secondary | ICD-10-CM

## 2021-01-30 DIAGNOSIS — Z0000 Encounter for general adult medical examination without abnormal findings: Secondary | ICD-10-CM

## 2021-01-30 DIAGNOSIS — Z131 Encounter for screening for diabetes mellitus: Secondary | ICD-10-CM | POA: Diagnosis not present

## 2021-01-30 DIAGNOSIS — E538 Deficiency of other specified B group vitamins: Secondary | ICD-10-CM | POA: Diagnosis not present

## 2021-01-30 DIAGNOSIS — E559 Vitamin D deficiency, unspecified: Secondary | ICD-10-CM

## 2021-01-30 DIAGNOSIS — F419 Anxiety disorder, unspecified: Secondary | ICD-10-CM

## 2021-01-30 DIAGNOSIS — E782 Mixed hyperlipidemia: Secondary | ICD-10-CM

## 2021-01-30 DIAGNOSIS — Z136 Encounter for screening for cardiovascular disorders: Secondary | ICD-10-CM

## 2021-01-30 DIAGNOSIS — E039 Hypothyroidism, unspecified: Secondary | ICD-10-CM | POA: Diagnosis not present

## 2021-01-30 DIAGNOSIS — K5790 Diverticulosis of intestine, part unspecified, without perforation or abscess without bleeding: Secondary | ICD-10-CM

## 2021-01-30 DIAGNOSIS — R7309 Other abnormal glucose: Secondary | ICD-10-CM | POA: Diagnosis not present

## 2021-01-30 DIAGNOSIS — F325 Major depressive disorder, single episode, in full remission: Secondary | ICD-10-CM

## 2021-01-30 DIAGNOSIS — Z79899 Other long term (current) drug therapy: Secondary | ICD-10-CM

## 2021-01-30 DIAGNOSIS — Z6821 Body mass index (BMI) 21.0-21.9, adult: Secondary | ICD-10-CM

## 2021-01-30 DIAGNOSIS — R918 Other nonspecific abnormal finding of lung field: Secondary | ICD-10-CM

## 2021-01-30 DIAGNOSIS — Z8249 Family history of ischemic heart disease and other diseases of the circulatory system: Secondary | ICD-10-CM | POA: Diagnosis not present

## 2021-01-30 NOTE — Progress Notes (Signed)
CPE  Assessment:   Encounter for Annual Physical Exam with abnormal findings Due annually  Health Maintenance reviewed Healthy lifestyle reviewed and goals set  Elevated blood pressure, situational Lifestyle controlled Monitor blood pressure at home; call if consistently over 130/80 Continue DASH diet.   Reminder to go to the ER if any CP, SOB, nausea, dizziness, severe HA, changes vision/speech, left arm numbness and tingling and jaw pain.  Mixed hyperlipidemia Lifestyle controlled Discussed dietary and exercise modifications Low fat diet Check lipids annually   Hypothyroidism, unspecified type Taking levothyroxine 50 mcg 6 days/week for many years Reminder to take on an empty stomach 30-42mns before first meal of the day. No antacid medications for 4 hours. Dr. BChalmers Cateralso follows   Depression, major, in remission (HCC)/ anxiety  Strong family hx; Continue lexapro 10 mg for maintenance Lifestyle discussed: diet/exerise, sleep hygiene, stress management, hydration  Other abnormal glucose (prediabetes) Discussed disease and risks Discussed diet/exercise, weight management  Check A1C q615m  Vitamin D deficiency disease Continue supplementation  -Vit D  Pulmonary nodules Seen incidentally in 2016; shared decision making and declined follow up imaging; denies concerning sx today.   B12 deficiency On supplement  Check yearly at CPE  Medication management Review at each visit; check CBC, CMP/GFR  BMI 21 Continue to recommend diet heavy in fruits and veggies and low in animal meats, cheeses, and dairy products, appropriate calorie intake Discuss exercise recommendations routinely Continue to monitor weight at each visit   Orders Placed This Encounter  Procedures   CBC with Differential/Platelet   COMPLETE METABOLIC PANEL WITH GFR   Magnesium   Lipid panel   TSH   Hemoglobin A1c   VITAMIN D 25 Hydroxy (Vit-D Deficiency, Fractures)   Microalbumin / creatinine  urine ratio   Urinalysis, Routine w reflex microscopic   Vitamin B12   EKG 12-Lead     Future Appointments  Date Time Provider [place]2/15/2023 11:00 AM MuMagda BernheimNP [facility] None  01/30/2022  2:00 PM CoLiane ComberNP [facility] None    Plan:   During the course of the visit the patient was educated and counseled about appropriate screening and preventive services including:   Pneumococcal vaccine  Influenza vaccine Td vaccine Screening electrocardiogram Screening mammography Bone densitometry screening Colorectal cancer screening Diabetes screening Glaucoma screening Nutrition counseling  Advanced directives: given info/requested   Subjective:   Debra Hayes a [age].o. female who presents for CPE. She has Anxiety; Depression, major, in remission [place] Hypothyroidism; Medication management; Pulmonary nodules; Elevated blood pressure, situational; Mixed hyperlipidemia; Other abnormal glucose (prediabetes); Vitamin D deficiency disease; and Diverticulosis on their problem list.  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   No concerns today.   She has anxiety/depression predating 2016, strong family hx; recently in remission lexapro 10 mg daily and would like to continue. Has done therapy in the past. Avoids alcohol which tends to cause depressed mood.   BMI is Body mass index is 21.82 kg/m., she is working on diet and exercise. Almost no meat. Admits to sweet tooth, cookies with afternoon tea, more fast food during the pandemic, trying to make better choices. Does eat pasta/basamati rice, no bread. She is very doing tai chi and line dancing, will intermittently walk.  Wt Readings from Last 3 Encounters:  01/30/21 141 lb 6.4 oz (64.1 kg)  08/01/20 138 lb (62.6  kg)  01/27/20 136 lb 9.6 oz (62 kg)   She has had elevated blood pressure in the  past but not treatment, lifestyle controlled. Her blood pressure has been controlled at home, today their BP is BP: 112/70 She does workout. She denies chest pain, shortness of breath, dizziness.   She has history of pulmonary lung nodules incidentally seen on cardiac index CT in 2016 that was recommended 1 year follow up; however she is very low risk for lung cancer, with shared decision making she opted not to pursue.   She is not on cholesterol medication and denies myalgias. Her cholesterol is at goal of LDL <130. CT coronary calcium score of 0 in 2016. The cholesterol last visit was:  Lab Results  Component Value Date   CHOL 218 (H) 01/27/2020   HDL 59 01/27/2020   LDLCALC 126 (H) 01/27/2020   TRIG 214 (H) 01/27/2020   CHOLHDL 3.7 01/27/2020     She has been working on diet and exercise for prediabetes, and denies paresthesia of the feet, polydipsia and polyuria.  Last A1C in the office was:  Lab Results  Component Value Date   HGBA1C 5.8 (H) 07/22/2019   Lab Results  Component Value Date   GFRNONAA 70 08/01/2020   Patient is on Vitamin D supplement, she is on 2000 IU. Lab Results  Component Value Date   VD25OH 38 07/22/2019   She is on thyroid medication, 50 mcg tablet 6 x a week. Her medication was not changed last visit, she is not on a biotin.  She follows with Dr. Chalmers Cater  Lab Results  Component Value Date   TSH 1.26 07/22/2019    She does take b complex daily.  Lab Results  Component Value Date   MIWOEHOZ22 482 05/17/2014     Medication Review Current Outpatient Medications on File Prior to Visit  Medication Sig Dispense Refill   CHOLECALCIFEROL PO Take 1 tablet by mouth daily. 2000 IU daily     escitalopram (LEXAPRO) 10 MG tablet Take 1 tablet (10 mg total) by mouth daily. 90 tablet 4   levothyroxine (SYNTHROID) 50 MCG tablet TAKE 1 TABLET BY MOUTH EVERY DAY BEFORE BREAKFAST 90 tablet 3   No current facility-administered medications on file prior to visit.     Current Problems (verified) Patient Active Problem List   Diagnosis Date Noted   Diverticulosis 01/30/2021   Elevated blood pressure, situational 03/20/2015   Mixed hyperlipidemia 03/20/2015   Other abnormal glucose (prediabetes) 03/20/2015   Vitamin D deficiency disease 03/20/2015   Pulmonary nodules 08/23/2014   Medication management 05/17/2014   Anxiety    Depression, major, in remission ([place])    Hypothyroidism     Screening Tests Immunization History  Administered Date(s) Administered   PFIZER Comirnaty(Gray Top)Covid-19 Tri-Sucrose Vaccine 09/25/2020   PFIZER(Purple Top)SARS-COV-2 Vaccination 07/24/2019, 08/18/2019, 04/29/2020, 09/25/2020   Td 06/30/2017   Tdap 06/17/2006   Preventative care: Last colonoscopy: 2014, Dr. Henrene Pastor, diverticulosis, due 10 years EGD 2014 normal  Last mammogram: 01/19/2020, she has phone number to schedule Last pap smear/pelvic exam: 2011 normal, never had abnormal, declines LMP age 1  DEXA: 06/2014 normal, "It was excellent"  Prior vaccinations: TD or Tdap: 2019 Influenza: 2008, declines Pneumococcal: declines Prevnar13:  declines Shingles/Zostavax: declines COVID 19: last 09/25/2020  Names of Other Physician/Practitioners you currently use: 1. [facility]- here for primary care 2. Dr. Trenton Gammon, dentist, last 2022, goes q20m3. Dr. MSabra Heck ophth, last 2021, will schedule 4.  Dr. Ubaldo Glassing, derm, last 02/2020, will continue with Dr. Amy Martinique, has scheduled next week.   Patient Care Team: Unk Pinto, MD as PCP - General (Internal Medicine) Rozetta Nunnery, MD as Consulting Physician (Otolaryngology) Melissa Noon, [place] as Referring Physician (Optometry) Rolm Bookbinder, MD as Consulting Physician (Dermatology) Irene Shipper, MD as Consulting Physician (Gastroenterology) Jacelyn Pi, MD as Consulting Physician (Endocrinology)   Allergies No Known Allergies  SURGICAL HISTORY She  has a  past surgical history that includes Thyroid cyst excision (1993); Tonsillectomy and adenoidectomy; and Colonoscopy. FAMILY HISTORY Her family history includes Bladder Cancer in her mother; Depression in her father and sister; Heart attack in her father; Heart disease (age of onset: 33) in her father; Mental illness in her sister; Suicidality in her sister; Thyroid cancer in her father. SOCIAL HISTORY She  reports that she quit smoking about 52 years ago. Her smoking use included cigarettes. She has never used smokeless tobacco. She reports that she does not currently use alcohol. She reports that she does not use drugs.  Review of Systems  Constitutional:  Negative for malaise/fatigue and weight loss.  HENT:  Negative for hearing loss and tinnitus.   Eyes:  Negative for blurred vision and double vision.  Respiratory:  Negative for cough, sputum production, shortness of breath and wheezing.   Cardiovascular:  Negative for chest pain, palpitations, orthopnea, claudication, leg swelling and PND.  Gastrointestinal:  Negative for abdominal pain, blood in stool, constipation, diarrhea, heartburn, melena, nausea and vomiting.  Genitourinary: Negative.   Musculoskeletal:  Negative for falls, joint pain and myalgias.  Skin:  Negative for rash.  Neurological:  Negative for dizziness, tingling, sensory change, weakness and headaches.  Endo/Heme/Allergies:  Negative for polydipsia.  Psychiatric/Behavioral: Negative.  Negative for depression, memory loss, substance abuse and suicidal ideas. The patient is not nervous/anxious and does not have insomnia.   All other systems reviewed and are negative.  Objective:   Blood pressure 112/70, pulse 76, temperature 97.8 F (36.6 C), resp. rate 16, height 5' 7.5" (1.715 m), weight 141 lb 6.4 oz (64.1 kg), SpO2 96 %. Body mass index is 21.82 kg/m.  General appearance: alert, no distress, WD/WN,  female HEENT: normocephalic, sclerae anicteric, TMs pearly, nares  patent, no discharge or erythema, pharynx normal Oral cavity: MMM, no lesions Neck: supple, no lymphadenopathy, no thyromegaly, no masses Heart: RRR, normal S1, S2, no murmurs Lungs: CTA bilaterally, no wheezes, rhonchi, or rales Abdomen: +bs, soft, non tender, non distended, no masses, no hepatomegaly, no splenomegaly Musculoskeletal: nontender, no swelling, no obvious deformity Extremities: no edema, no cyanosis, no clubbing Pulses: 2+ symmetric, upper and lower extremities, normal cap refill Neurological: alert, oriented x 3, CN2-12 intact, strength normal upper extremities and lower extremities, sensation normal throughout, DTRs 2+ throughout, no cerebellar signs, gait normal Psychiatric: normal affect, behavior normal, pleasant  Breast: defer, getting mammogram, no concerns Gyn: defer Rectal: defer Skin: warm dry intact; no concerning lesions, rash, ecchymoses.   EKG: NSR, PAC, IRBBB, NSCPT   Izora Ribas, NP   01/30/2021

## 2021-01-30 NOTE — Patient Instructions (Signed)
  Ms. Mcpeters , Thank you for taking time to come for your Annual Wellness Visit. I appreciate your ongoing commitment to your health goals. Please review the following plan we discussed and let me know if I can assist you in the future.   This is a list of the screening recommended for you and due dates:  Health Maintenance  Topic Date Due   COVID-19 Vaccine (5 - Booster for Pfizer series) 01/25/2021   Flu Shot  03/18/2021*   Mammogram  01/18/2022   Tetanus Vaccine  07/01/2027   DEXA scan (bone density measurement)  Completed   Hepatitis C Screening: USPSTF Recommendation to screen - Ages 62-79 yo.  Completed   HPV Vaccine  Aged Out   Pneumonia vaccines  Discontinued   Zoster (Shingles) Vaccine  Discontinued  *Topic was postponed. The date shown is not the original due date.   Know what a healthy weight is for you (roughly BMI <25) and aim to maintain this  Aim for 7+ servings of fruits and vegetables daily  65-80+ fluid ounces of water or unsweet tea for healthy kidneys  Limit to max 1 drink of alcohol per day; avoid smoking/tobacco  Limit animal fats in diet for cholesterol and heart health - choose grass fed whenever available  Avoid highly processed foods, and foods high in saturated/trans fats  Aim for low stress - take time to unwind and care for your mental health  Aim for 150 min of moderate intensity exercise weekly for heart health, and weights twice weekly for bone health  Aim for 7-9 hours of sleep daily     A great goal to work towards is aiming to get in a serving daily of some of the most nutritionally dense foods - G- BOMBS daily

## 2021-01-31 LAB — MICROALBUMIN / CREATININE URINE RATIO
Creatinine, Urine: 139 mg/dL (ref 20–275)
Microalb, Ur: <0.2 mg/dL

## 2021-01-31 LAB — CBC WITH DIFFERENTIAL/PLATELET
Absolute Monocytes: 536 cells/uL (ref 200–950)
Basophils Absolute: 29 cells/uL (ref 0–200)
Basophils Relative: 0.5 %
Eosinophils Absolute: 177 cells/uL (ref 15–500)
Eosinophils Relative: 3.1 %
HCT: 44.4 % (ref 35.0–45.0)
Hemoglobin: 15.1 g/dL (ref 11.7–15.5)
Lymphs Abs: 1853 cells/uL (ref 850–3900)
MCH: 30.9 pg (ref 27.0–33.0)
MCHC: 34 g/dL (ref 32.0–36.0)
MCV: 90.8 fL (ref 80.0–100.0)
MPV: 11.3 fL (ref 7.5–12.5)
Monocytes Relative: 9.4 %
Neutro Abs: 3107 cells/uL (ref 1500–7800)
Neutrophils Relative %: 54.5 %
Platelets: 290 10*3/uL (ref 140–400)
RBC: 4.89 10*6/uL (ref 3.80–5.10)
RDW: 12.5 % (ref 11.0–15.0)
Total Lymphocyte: 32.5 %
WBC: 5.7 10*3/uL (ref 3.8–10.8)

## 2021-01-31 LAB — URINALYSIS, ROUTINE W REFLEX MICROSCOPIC
Bilirubin Urine: NEGATIVE
Glucose, UA: NEGATIVE
Hgb urine dipstick: NEGATIVE
Ketones, ur: NEGATIVE
Leukocytes,Ua: NEGATIVE
Nitrite: NEGATIVE
Protein, ur: NEGATIVE
Specific Gravity, Urine: 1.021 (ref 1.001–1.035)
pH: <=5 (ref 5.0–8.0)

## 2021-01-31 LAB — COMPLETE METABOLIC PANEL WITH GFR
AG Ratio: 2 (calc) (ref 1.0–2.5)
ALT: 19 U/L (ref 6–29)
AST: 22 U/L (ref 10–35)
Albumin: 4.6 g/dL (ref 3.6–5.1)
Alkaline phosphatase (APISO): 62 U/L (ref 37–153)
BUN: 20 mg/dL (ref 7–25)
CO2: 30 mmol/L (ref 20–32)
Calcium: 9.3 mg/dL (ref 8.6–10.4)
Chloride: 106 mmol/L (ref 98–110)
Creat: 0.96 mg/dL (ref 0.60–1.00)
Globulin: 2.3 g/dL (calc) (ref 1.9–3.7)
Glucose, Bld: 80 mg/dL (ref 65–99)
Potassium: 4.3 mmol/L (ref 3.5–5.3)
Sodium: 141 mmol/L (ref 135–146)
Total Bilirubin: 0.4 mg/dL (ref 0.2–1.2)
Total Protein: 6.9 g/dL (ref 6.1–8.1)
eGFR: 62 mL/min/{1.73_m2} (ref >=60)

## 2021-01-31 LAB — LIPID PANEL
Cholesterol: 256 mg/dL — ABNORMAL HIGH (ref <200)
HDL: 65 mg/dL (ref >=50)
Non-HDL Cholesterol (Calc): 191 mg/dL (calc) — ABNORMAL HIGH (ref <130)
Total CHOL/HDL Ratio: 3.9 (calc) (ref <5.0)
Triglycerides: 406 mg/dL — ABNORMAL HIGH (ref <150)

## 2021-01-31 LAB — HEMOGLOBIN A1C
Hgb A1c MFr Bld: 5.6 % of total Hgb (ref <5.7)
Mean Plasma Glucose: 114 mg/dL
eAG (mmol/L): 6.3 mmol/L

## 2021-01-31 LAB — VITAMIN D 25 HYDROXY (VIT D DEFICIENCY, FRACTURES): Vit D, 25-Hydroxy: 47 ng/mL (ref 30–100)

## 2021-01-31 LAB — MAGNESIUM: Magnesium: 2.3 mg/dL (ref 1.5–2.5)

## 2021-01-31 LAB — TSH: TSH: 1.39 mIU/L (ref 0.40–4.50)

## 2021-01-31 LAB — VITAMIN B12: Vitamin B-12: 729 pg/mL (ref 200–1100)

## 2021-02-01 ENCOUNTER — Encounter: Payer: Self-pay | Admitting: *Deleted

## 2021-02-01 ENCOUNTER — Encounter: Payer: Self-pay | Admitting: Internal Medicine

## 2021-02-01 NOTE — Progress Notes (Unsigned)
error 

## 2021-02-13 ENCOUNTER — Encounter: Payer: Self-pay | Admitting: Internal Medicine

## 2021-02-13 ENCOUNTER — Other Ambulatory Visit: Payer: Self-pay

## 2021-02-13 ENCOUNTER — Ambulatory Visit (INDEPENDENT_AMBULATORY_CARE_PROVIDER_SITE_OTHER): Payer: PPO | Admitting: Internal Medicine

## 2021-02-13 VITALS — BP 114/78 | HR 69 | Temp 97.8°F | Resp 16 | Ht 67.5 in | Wt 139.6 lb

## 2021-02-13 DIAGNOSIS — E039 Hypothyroidism, unspecified: Secondary | ICD-10-CM

## 2021-02-13 DIAGNOSIS — F419 Anxiety disorder, unspecified: Secondary | ICD-10-CM

## 2021-02-13 NOTE — Progress Notes (Signed)
    Future Appointments  Date Time Provider [place]  08/02/2021  2:30 PM Liane Comber, NP [facility] None  01/30/2022  2:00 PM Liane Comber, NP [facility] None    History of Present Illness:     This very nice 75 yo [name] with HTN, HLD, PPreDM, Hypothyroidism,. Dysthymia who presents with concerns of her Lexapro "not working". Previous to Lexapro, she had taken Zoloft and was changed to Lexapro as she felt the Zoloft "wasn't working".    Now on Lexapro about 6 years, she felt she was doing well until a manufacturer change with her current Rx of Lexapro. She has spoken with her Pharmacist who agreed to try to order her Rx from her previous company. Her main sx  is increased feelings  of anxiety and  denies any depressive setback.   Medications   levothyroxine  50 MCG tablet, TAKE 1 TABLET  EVERY DAY BEFORE BREAKFAST   CHOLECALCIFEROL , Take 1 tablet daily. 2000 IU daily   escitalopram (LEXAPRO) 10 MG tablet, Take 1 tablet daily.  Problem list She has Anxiety; Depression, major, in remission ([place]); Hypothyroidism; Medication management; Pulmonary nodules; Elevated blood pressure, situational; Mixed hyperlipidemia; Other abnormal glucose (prediabetes); Vitamin D deficiency disease; and Diverticulosis on their problem list.   Observations/Objective:  BP 114/78   Pulse 69   Temp 97.8 F (36.6 C)   Resp 16   Ht 5' 7.5" (1.715 m)   Wt 139 lb 9.6 oz (63.3 kg)   SpO2 98%   BMI 21.54 kg/m   HEENT - WNL. Neck - supple.  Chest - Clear equal BS. Cor - Nl HS. RRR w/o sig MGR. PP 1(+). No edema. MS- FROM w/o deformities.  Gait Nl. Neuro -  Nl w/o focal abnormalities. Psyche - Pleasant affect . O x 3 . No SI.   Assessment and Plan:  1. Hypothyroidism,  2. Chronic anxiety  - Patient indicates that she prefers to wait and re-try her previous Mfg supply of Lexapro. I advised if sx's not improve with in 2-4 weeks to try  increase her Lexapro 10 mg to 1.5 tab ( = 15 mg ) daily .   She is in agreement with this plan. Over 25 minutes of exam, counseling, chart review and  complex critical decision making was performed   Follow Up Instructions:        I discussed the assessment and treatment plan with the patient. The patient was provided an opportunity to ask questions and all were answered. The patient agreed with the plan and demonstrated an understanding of the instructions.       The patient was advised to call back or seek an in-person evaluation if the symptoms worsen or if the condition fails to improve as anticipated.   Kirtland Bouchard, MD

## 2021-02-18 ENCOUNTER — Encounter: Payer: Self-pay | Admitting: Internal Medicine

## 2021-03-15 ENCOUNTER — Other Ambulatory Visit: Payer: Self-pay | Admitting: Internal Medicine

## 2021-03-15 DIAGNOSIS — F325 Major depressive disorder, single episode, in full remission: Secondary | ICD-10-CM

## 2021-03-15 MED ORDER — ESCITALOPRAM OXALATE 20 MG PO TABS: ORAL_TABLET | ORAL | 3 refills | Status: DC

## 2021-04-17 ENCOUNTER — Ambulatory Visit: Payer: PPO

## 2021-05-14 ENCOUNTER — Other Ambulatory Visit: Payer: Self-pay

## 2021-05-14 ENCOUNTER — Ambulatory Visit (INDEPENDENT_AMBULATORY_CARE_PROVIDER_SITE_OTHER): Payer: PPO | Admitting: Internal Medicine

## 2021-05-14 ENCOUNTER — Encounter: Payer: Self-pay | Admitting: Internal Medicine

## 2021-05-14 VITALS — BP 126/77 | HR 92 | Temp 97.8°F | Resp 16 | Ht 67.5 in | Wt 137.4 lb

## 2021-05-14 DIAGNOSIS — F419 Anxiety disorder, unspecified: Secondary | ICD-10-CM | POA: Diagnosis not present

## 2021-05-14 DIAGNOSIS — C323 Malignant neoplasm of laryngeal cartilage: Secondary | ICD-10-CM

## 2021-05-14 DIAGNOSIS — E039 Hypothyroidism, unspecified: Secondary | ICD-10-CM | POA: Diagnosis not present

## 2021-05-14 DIAGNOSIS — R030 Elevated blood-pressure reading, without diagnosis of hypertension: Secondary | ICD-10-CM

## 2021-05-14 MED ORDER — DULOXETINE HCL 20 MG PO CPEP: ORAL_CAPSULE | ORAL | 1 refills | Status: DC

## 2021-05-14 NOTE — Progress Notes (Signed)
    Future Appointments  Date Time Provider Department  08/01/2021  3:30 PM Liane Comber, NP [facility]  01/30/2022  2:00 PM Liane Comber, NP [facility]    History of Present Illness:     This very nice 75 yo [name] with labile HTN, HLD, PreDM, Hypothyroidism & Dysthymia presents acutely today for f/u  after a visit in August for acute /chronic when her Lexapro was increased fro 10 to 20 mg  /daily . She now relates that she initially disd well for 2+ months , but now again feels overwhelmed with anxiety. She relates she has researched SSRI's on the Internet & has decided that she may do better with Celexa. Extensive discussion ensued & she had a difficult time understanding that Celexa metabolizes in the liver to the active form - Lexapro.   Medications    levothyroxine 50 MCG tablet, TAKE 1 TABLET EVERY DAY     CHOLECALCIFEROL  2000 IU  - Take 1 tablet daily. daily   escitalopram  20 MG tablet, Take  1 tablet  Daily  for Mood  Problem list She has Anxiety; Depression, major, in remission ([place]); Hypothyroidism; Medication management; Pulmonary nodules; Elevated blood pressure, situational; Mixed hyperlipidemia; Other abnormal glucose (prediabetes); Vitamin D deficiency disease; and Diverticulosis on their problem list.   Observations/Objective:  BP 126/77   Pulse 92   Temp 97.8 F (36.6 C)   Resp 16   Ht 5' 7.5" (1.715 m)   Wt 137 lb 6.4 oz (62.3 kg)   SpO2 97%   BMI 21.20 kg/m   HEENT - WNL. Neck - supple.  Chest - Clear . Cor - Nl HS. RRR w/o sig M. MS- FROM w/o deformities.  Gait Nl. Neuro -  Nl w/o focal abnormalities. Psyche - Very anxious.   Assessment and Plan:   1. Elevated blood pressure, situational   2. Hypothyroidism   3. Chronic anxiety  - DULoxetine 20 MG capsule;  Take 1 capsule 2 x /day for Mood / Acute & Chronic Anxiety   Dispense: 180 capsule; Refill: 1  - After prolonged discussion , explanation & reassurance , patient finally agree to  taper her Lexapro off over the next 2 week, while  she begin treatment with Duloxetine 20 mg  - low dose  for the 1st & 2sd week & then up to bid. Advised to ROV 3-4 weeks or call sooner for appt if problems.  She wa adverse to taking recommended Buspar for acute anxiety    Follow Up Instructions:        I discussed the assessment and treatment plan with the patient. The patient was provided an opportunity to ask questions and all were answered. The patient agreed with the plan and demonstrated an understanding of the instructions.       The patient was advised to call back or seek an in-person evaluation if the symptoms worsen or if the condition fails to improve as anticipated.    Kirtland Bouchard, MD

## 2021-05-30 DIAGNOSIS — D1801 Hemangioma of skin and subcutaneous tissue: Secondary | ICD-10-CM | POA: Diagnosis not present

## 2021-05-30 DIAGNOSIS — L821 Other seborrheic keratosis: Secondary | ICD-10-CM | POA: Diagnosis not present

## 2021-05-30 DIAGNOSIS — L603 Nail dystrophy: Secondary | ICD-10-CM | POA: Diagnosis not present

## 2021-05-30 DIAGNOSIS — D2271 Melanocytic nevi of right lower limb, including hip: Secondary | ICD-10-CM | POA: Diagnosis not present

## 2021-05-30 DIAGNOSIS — D225 Melanocytic nevi of trunk: Secondary | ICD-10-CM | POA: Diagnosis not present

## 2021-05-30 DIAGNOSIS — L814 Other melanin hyperpigmentation: Secondary | ICD-10-CM | POA: Diagnosis not present

## 2021-05-30 DIAGNOSIS — D692 Other nonthrombocytopenic purpura: Secondary | ICD-10-CM | POA: Diagnosis not present

## 2021-05-30 DIAGNOSIS — B351 Tinea unguium: Secondary | ICD-10-CM | POA: Diagnosis not present

## 2021-05-30 DIAGNOSIS — Z419 Encounter for procedure for purposes other than remedying health state, unspecified: Secondary | ICD-10-CM | POA: Diagnosis not present

## 2021-06-12 ENCOUNTER — Ambulatory Visit (INDEPENDENT_AMBULATORY_CARE_PROVIDER_SITE_OTHER): Payer: PPO | Admitting: Internal Medicine

## 2021-06-12 ENCOUNTER — Other Ambulatory Visit: Payer: Self-pay

## 2021-06-12 ENCOUNTER — Encounter: Payer: Self-pay | Admitting: Internal Medicine

## 2021-06-12 VITALS — BP 122/76 | HR 82 | Temp 97.9°F | Resp 16 | Ht 67.5 in | Wt 134.8 lb

## 2021-06-12 DIAGNOSIS — R030 Elevated blood-pressure reading, without diagnosis of hypertension: Secondary | ICD-10-CM | POA: Diagnosis not present

## 2021-06-12 DIAGNOSIS — E039 Hypothyroidism, unspecified: Secondary | ICD-10-CM | POA: Diagnosis not present

## 2021-06-12 NOTE — Patient Instructions (Signed)

## 2021-06-12 NOTE — Progress Notes (Signed)
° ° °  Future Appointments  Date Time Provider Department  06/12/2021  3:30 PM Unk Pinto, MD [facility]  08/01/2021  3:30 PM Liane Comber, NP [facility]  01/30/2022  2:00 PM Liane Comber, NP [facility]    History of Present Illness:      Patient is a very nice 75 yo [name] with labile HTN, HLD, PreDM, Hypothyroidism & Dysthymia who returns for 1 month f/u after transitioning off Lexapro to Duloxetine 20 mg 2 x /day.   Patient relates that she "feels normal again and is only taking Duloxetine 20 mg once daily . She relates anxiety controlled and no depression.    Medications    levothyroxine 50 MCG tablet, TAKE 1 TABLET EVERY DAY BEFORE BREAKFAST    Vitamin D  2000 IU - Take 1 tablet daily.  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   Observations/Objective:  BP 122/76    Pulse 82    Temp 97.9 F (36.6 C)    Resp 16    Ht 5' 7.5" (1.715 m)    Wt 134 lb 12.8 oz (61.1 kg)    SpO2 95%    BMI 20.80 kg/m   No formal exam. Approx 20 minutes discussing her hx/o anxieties   Assessment and Plan:  1. Hypothyroidism  2. Elevated blood pressure, hx    Follow Up Instructions:      Has f/u in Feb w/Ashley        I discussed the assessment and treatment plan with the patient. The patient was provided an opportunity to ask questions and all were answered. The patient agreed with the plan and demonstrated an understanding of the instructions.       The patient was advised to call back or seek an in-person evaluation if the symptoms worsen or if the condition fails to improve as  anticipated.   Kirtland Bouchard, MD

## 2021-08-01 ENCOUNTER — Ambulatory Visit (INDEPENDENT_AMBULATORY_CARE_PROVIDER_SITE_OTHER): Payer: PPO | Admitting: Adult Health

## 2021-08-01 ENCOUNTER — Other Ambulatory Visit: Payer: Self-pay

## 2021-08-01 ENCOUNTER — Encounter: Payer: Self-pay | Admitting: Adult Health

## 2021-08-01 ENCOUNTER — Ambulatory Visit: Payer: PPO | Admitting: Nurse Practitioner

## 2021-08-01 VITALS — BP 122/64 | HR 77 | Temp 97.5°F | Wt 134.0 lb

## 2021-08-01 DIAGNOSIS — K5790 Diverticulosis of intestine, part unspecified, without perforation or abscess without bleeding: Secondary | ICD-10-CM | POA: Diagnosis not present

## 2021-08-01 DIAGNOSIS — E782 Mixed hyperlipidemia: Secondary | ICD-10-CM | POA: Diagnosis not present

## 2021-08-01 DIAGNOSIS — F325 Major depressive disorder, single episode, in full remission: Secondary | ICD-10-CM | POA: Diagnosis not present

## 2021-08-01 DIAGNOSIS — Z0001 Encounter for general adult medical examination with abnormal findings: Secondary | ICD-10-CM | POA: Diagnosis not present

## 2021-08-01 DIAGNOSIS — R6889 Other general symptoms and signs: Secondary | ICD-10-CM | POA: Diagnosis not present

## 2021-08-01 DIAGNOSIS — Z0000 Encounter for general adult medical examination without abnormal findings: Secondary | ICD-10-CM

## 2021-08-01 DIAGNOSIS — F419 Anxiety disorder, unspecified: Secondary | ICD-10-CM

## 2021-08-01 DIAGNOSIS — R7309 Other abnormal glucose: Secondary | ICD-10-CM | POA: Diagnosis not present

## 2021-08-01 DIAGNOSIS — E559 Vitamin D deficiency, unspecified: Secondary | ICD-10-CM

## 2021-08-01 DIAGNOSIS — E039 Hypothyroidism, unspecified: Secondary | ICD-10-CM | POA: Diagnosis not present

## 2021-08-01 DIAGNOSIS — R030 Elevated blood-pressure reading, without diagnosis of hypertension: Secondary | ICD-10-CM

## 2021-08-01 DIAGNOSIS — Z79899 Other long term (current) drug therapy: Secondary | ICD-10-CM

## 2021-08-01 MED ORDER — DULOXETINE HCL 20 MG PO CPEP: ORAL_CAPSULE | ORAL | 3 refills | Status: DC

## 2021-08-01 NOTE — Progress Notes (Signed)
CPE  Assessment:   Encounter for Annual Physical Exam with abnormal findings Due annually  Health Maintenance reviewed Healthy lifestyle reviewed and goals set  Declines vaccines excepting covid 19, risks/benefits reviewed and will review at least annually  Elevated blood pressure, situational Lifestyle controlled Monitor blood pressure at home; call if consistently over 130/80 Continue DASH diet.   Reminder to go to the ER if any CP, SOB, nausea, dizziness, severe HA, changes vision/speech, left arm numbness and tingling and jaw pain.  Mixed hyperlipidemia Atypically elevated last visit Discussed dietary and exercise modifications Low fat diet Check lipids   Hypothyroidism, unspecified type Taking levothyroxine 50 mcg 6 days/week for many years Reminder to take on an empty stomach 30-48mns before first meal of the day. No antacid medications for 4 hours. Dr. BChalmers Cateralso follows   Depression, major, in remission (HCC)/ anxiety  Strong family hx; Continue cymbalta 20 mg daily for maintenance Lifestyle discussed: diet/exerise, sleep hygiene, stress management, hydration  Other abnormal glucose (prediabetes) Discussed disease and risks Discussed diet/exercise, weight management  Check A1C at CPE  Vitamin D deficiency disease Continue supplementation   Pulmonary nodules Seen incidentally in 2016; shared decision making and declined follow up imaging; denies concerning sx today.   B12 deficiency On supplement  Check yearly at CPE  Medication management Review at each visit; check CBC, CMP/GFR  BMI 21 Continue to recommend diet heavy in fruits and veggies and low in animal meats, cheeses, and dairy products, appropriate calorie intake Discuss exercise recommendations routinely Continue to monitor weight at each visit   Orders Placed This Encounter  Procedures   CBC with Differential/Platelet   COMPLETE METABOLIC PANEL WITH GFR   Lipid panel   VITAMIN D 25  Hydroxy (Vit-D Deficiency, Fractures)    Future Appointments  Date Time Provider [place] 01/31/2022  9:00 AM MMagda Bernheim NP [facility] None  08/01/2022  3:00 PM MMagda Bernheim NP [facility] None    Plan:   During the course of the visit the patient was educated and counseled about appropriate screening and preventive services including:   Pneumococcal vaccine  Influenza vaccine Td vaccine Screening electrocardiogram Screening mammography Bone densitometry screening Colorectal cancer screening Diabetes screening Glaucoma screening Nutrition counseling  Advanced directives: given info/requested   Subjective:   [name] a 76y.o. female who presents for AWV and follow up. She has Anxiety; Depression, major, in remission ([place]; Hypothyroidism; Medication management; Pulmonary nodules; Elevated blood pressure, situational; Mixed hyperlipidemia; Other abnormal glucose (prediabetes); Vitamin D deficiency disease; and Diverticulosis on their problem list.  She has anxiety/depression predating 2016, strong family hx; recently switched from lexapro to cymbalta 20 mg and report doing very well. Has done therapy in the past. Avoids alcohol which tends to cause depressed mood.   BMI is Body mass index is 20.68 kg/m., she is working on diet and exercise. Almost no meat. Admits to sweet tooth, cookies with afternoon tea, more fast food during the pandemic, trying to make better choices. Does eat pasta/basamati rice, no bread. She is very doing tai chi and line dancing, will intermittently walk.  Wt Readings from Last 3 Encounters:  08/01/21 134 lb (60.8 kg)  06/12/21 134 lb 12.8 oz (61.1 kg)  05/14/21 137 lb 6.4 oz (62.3 kg)   She has had elevated blood pressure in the past but not treatment, lifestyle controlled. Her blood pressure has been controlled at home, today their BP is BP: 122/64 She does workout. She denies chest  pain, shortness of breath, dizziness.   She  has history of pulmonary lung nodules incidentally seen on cardiac index CT in 2016 that was recommended 1 year follow up; however she is very low risk for lung cancer, with shared decision making she opted not to pursue.   She is not on cholesterol medication and denies myalgias. Her cholesterol is typically at goal of LDL <130, last visit with atypical trigs. CT coronary calcium score of 0 in 2016. The cholesterol last visit was:  Lab Results  Component Value Date   CHOL 256 (H) 01/30/2021   HDL 65 01/30/2021   [place]  01/30/2021     Comment:     . LDL cholesterol not calculated. Triglyceride levels greater than 400 mg/dL invalidate calculated LDL results. . Reference range: <100 . Desirable range <100 mg/dL for primary prevention;   <70 mg/dL for patients with CHD or diabetic patients  with > or = 2 CHD risk factors. Marland Kitchen LDL-C is now calculated using the Martin-Hopkins  calculation, which is a validated novel method providing  better accuracy than the Friedewald equation in the  estimation of LDL-C.  Cresenciano Genre et al. Annamaria Helling. 1115;520(80): 2061-2068  (http://education.[employer].com/faq/FAQ164)    TRIG 406 (H) 01/30/2021   CHOLHDL 3.9 01/30/2021     She has been working on diet and exercise for prediabetes, and denies paresthesia of the feet, polydipsia and polyuria.  Last A1C in the office was:  Lab Results  Component Value Date   HGBA1C 5.6 01/30/2021   Lab Results  Component Value Date   GFRNONAA 70 08/01/2020   Patient is on Vitamin D supplement, she is on 2000 IU. Lab Results  Component Value Date   VD25OH 47 01/30/2021   She is on thyroid medication, levothyroxine 50 mcg 6 days a week for many years. Her medication was not changed last visit, she is not on a biotin.  She follows with Dr. Chalmers Cater, has up coming visit, declines check today Lab Results  Component Value Date   TSH 1.39 01/30/2021   She does take b complex daily.  Lab Results  Component Value  Date   [id number] 01/30/2021     Medication Review Current Outpatient Medications on File Prior to Visit  Medication Sig Dispense Refill   CHOLECALCIFEROL PO Take 1 tablet by mouth daily. 2000 IU daily     levothyroxine (SYNTHROID) 50 MCG tablet TAKE 1 TABLET BY MOUTH EVERY DAY BEFORE BREAKFAST 90 tablet 3   No current facility-administered medications on file prior to visit.    Current Problems (verified) Patient Active Problem List   Diagnosis Date Noted   Diverticulosis 01/30/2021   Elevated blood pressure, situational 03/20/2015   Mixed hyperlipidemia 03/20/2015   Other abnormal glucose (prediabetes) 03/20/2015   Vitamin D deficiency disease 03/20/2015   Pulmonary nodules 08/23/2014   Medication management 05/17/2014   Anxiety    Depression, major, in remission ([place])    Hypothyroidism     Screening Tests Immunization History  Administered Date(s) Administered   PFIZER Comirnaty(Gray Top)Covid-19 Tri-Sucrose Vaccine 09/25/2020   PFIZER(Purple Top)SARS-COV-2 Vaccination 07/24/2019, 08/18/2019, 04/29/2020, 09/25/2020   Td 06/30/2017   Tdap 06/17/2006   Health Maintenance  Topic Date Due   COVID-19 Vaccine (6 - Booster for Pfizer series) 11/20/2020   INFLUENZA VACCINE  09/14/2021 (Originally 01/15/2021)   Pneumonia Vaccine 53+ Years old (1 - PCV) 08/01/2022 (Originally 05/15/2011)   MAMMOGRAM  01/18/2022   TETANUS/TDAP  07/01/2027   DEXA SCAN  Completed  Hepatitis C Screening  Completed   HPV VACCINES  Aged Out   COLONOSCOPY (Pts 45-48yr Insurance coverage will need to be confirmed)  Discontinued   Zoster Vaccines- Shingrix  Discontinued   Last colonoscopy: 2014, Dr. PHenrene Pastor diverticulosis, 10 year recall  EGD 2014 normal  Last mammogram: 01/19/2020, she has phone number to schedule Last pap smear/pelvic exam: 2011 normal, never had abnormal, declines LMP age 76 DEXA: 06/2014 normal, "It was excellent"  Getting covid 19 vaccines but otherwise declines    Declines other vaccine  Names of Other Physician/Practitioners you currently use: 1. [facility]- here for primary care 2. Dr. PTrenton Gammon dentist, last 2023, goes q649m. Dr. MiSabra Heckophth, last 2023 4. Dr. JoMartiniquederm, last 2022, will be seeing new provider next visit   Patient Care Team: McUnk PintoMD as PCP - General (Internal Medicine) NeRozetta NunneryMD (Inactive) as Consulting Physician (Otolaryngology) GoMelissa NoonODSt. [place] Referring Physician (Optometry) LoRolm BookbinderMD as Consulting Physician (Dermatology) PeIrene ShipperMD as Consulting Physician (Gastroenterology) BaJacelyn PiMD as Consulting Physician (Endocrinology)   Allergies No Known Allergies  SURGICAL HISTORY She  has a past surgical history that includes Thyroid cyst excision (1993); Tonsillectomy and adenoidectomy; and Colonoscopy. FAMILY HISTORY Her family history includes Bladder Cancer in her mother; Depression in her father and sister; Heart attack in her father; Heart disease (age of onset: 508in her father; Mental illness in her sister; Suicidality in her sister; Thyroid cancer in her father. SOCIAL HISTORY She  reports that she quit smoking about 53 years ago. Her smoking use included cigarettes. She has never used smokeless tobacco. She reports that she does not currently use alcohol. She reports that she does not use drugs.  MEDICARE WELLNESS OBJECTIVES: Physical activity: Current Exercise Habits: Home exercise routine, Type of exercise: Other - see comments (line dancing), Time (Minutes): 50, Frequency (Times/Week): 3, Weekly Exercise (Minutes/Week): 150, Intensity: Mild, Exercise limited by: None identified Cardiac risk factors: Cardiac Risk Factors include: advanced age (>5536m >65>62men);dyslipidemia;hypertension;smoking/ tobacco exposure Depression/mood screen:   Depression screen [facility] 08/01/2021  Decreased Interest 0  Down, Depressed,  Hopeless 0  PHQ - 2 Score 0  Altered sleeping 0  Tired, decreased energy 0  Change in appetite 0  Feeling bad or failure about yourself  0  Trouble concentrating 0  Moving slowly or fidgety/restless 0  Suicidal thoughts 0  PHQ-9 Score 0  Difficult doing work/chores Not difficult at all    ADLs:  In your present state of health, do you have any difficulty performing the following activities: 08/01/2021  Hearing? N  Vision? N  Difficulty concentrating or making decisions? N  Walking or climbing stairs? N  Dressing or bathing? N  Doing errands, shopping? N  Some recent data might be hidden     Cognitive Testing  Alert? Yes  Normal Appearance?Yes  Oriented to person? Yes  Place? Yes   Time? Yes  Recall of three objects?  Yes  Can perform simple calculations? Yes  Displays appropriate judgment?Yes  Can read the correct time from a watch face?Yes  EOL planning: Does Patient Have a Medical Advance Directive?: Yes Type of Advance Directive: Healthcare Power of Attorney, Living will Does patient want to make changes to medical advance directive?: No - Patient declined Copy of [place]?: No - copy requested     Review of Systems  Constitutional:  Negative for malaise/fatigue and weight loss.  HENT:  Negative for hearing loss and tinnitus.   Eyes:  Negative for blurred vision and double vision.  Respiratory:  Negative for cough, sputum production, shortness of breath and wheezing.   Cardiovascular:  Negative for chest pain, palpitations, orthopnea, claudication, leg swelling and PND.  Gastrointestinal:  Negative for abdominal pain, blood in stool, constipation, diarrhea, heartburn, melena, nausea and vomiting.  Genitourinary: Negative.   Musculoskeletal:  Negative for falls, joint pain and myalgias.  Skin:  Negative for rash.  Neurological:  Negative for dizziness, tingling, sensory change, weakness and headaches.  Endo/Heme/Allergies:  Negative for  polydipsia.  Psychiatric/Behavioral: Negative.  Negative for depression, memory loss, substance abuse and suicidal ideas. The patient is not nervous/anxious and does not have insomnia.   All other systems reviewed and are negative.  Objective:   Blood pressure 122/64, pulse 77, temperature (!) 97.5 F (36.4 C), weight 134 lb (60.8 kg), SpO2 99 %. Body mass index is 20.68 kg/m.  General appearance: alert, no distress, WD/WN,  female HEENT: normocephalic, sclerae anicteric, TMs pearly, nares patent, no discharge or erythema, pharynx normal Oral cavity: MMM, no lesions Neck: supple, no lymphadenopathy, no thyromegaly, no masses Heart: RRR, normal S1, S2, no murmurs Lungs: CTA bilaterally, no wheezes, rhonchi, or rales Abdomen: +bs, soft, non tender, non distended, no masses, no hepatomegaly, no splenomegaly Musculoskeletal: nontender, no swelling, no obvious deformity Extremities: no edema, no cyanosis, no clubbing Pulses: 2+ symmetric, upper and lower extremities, normal cap refill Neurological: alert, oriented x 3, CN2-12 intact, strength normal upper extremities and lower extremities, sensation normal throughout, DTRs 2+ throughout, no cerebellar signs, gait normal Psychiatric: normal affect, behavior normal, pleasant     Medicare Attestation I have personally reviewed: The patient's medical and social history Their use of alcohol, tobacco or illicit drugs Their current medications and supplements The patient's functional ability including ADLs,fall risks, home safety risks, cognitive, and hearing and visual impairment Diet and physical activities Evidence for depression or mood disorders  The patient's weight, height, BMI, and visual acuity have been recorded in the chart.  I have made referrals, counseling, and provided education to the patient based on review of the above and I have provided the patient with a written personalized care plan for preventive services.        Izora Ribas, NP   08/01/2021

## 2021-08-02 ENCOUNTER — Ambulatory Visit: Payer: PPO | Admitting: Adult Health

## 2021-08-02 LAB — COMPLETE METABOLIC PANEL WITH GFR
AG Ratio: 1.8 (calc) (ref 1.0–2.5)
ALT: 16 U/L (ref 6–29)
AST: 23 U/L (ref 10–35)
Albumin: 4.4 g/dL (ref 3.6–5.1)
Alkaline phosphatase (APISO): 66 U/L (ref 37–153)
BUN: 18 mg/dL (ref 7–25)
CO2: 27 mmol/L (ref 20–32)
Calcium: 9.4 mg/dL (ref 8.6–10.4)
Chloride: 105 mmol/L (ref 98–110)
Creat: 0.99 mg/dL (ref 0.60–1.00)
Globulin: 2.4 g/dL (calc) (ref 1.9–3.7)
Glucose, Bld: 103 mg/dL — ABNORMAL HIGH (ref 65–99)
Potassium: 4.2 mmol/L (ref 3.5–5.3)
Sodium: 139 mmol/L (ref 135–146)
Total Bilirubin: 0.4 mg/dL (ref 0.2–1.2)
Total Protein: 6.8 g/dL (ref 6.1–8.1)
eGFR: 59 mL/min/{1.73_m2} — ABNORMAL LOW (ref >=60)

## 2021-08-02 LAB — CBC WITH DIFFERENTIAL/PLATELET
Absolute Monocytes: 526 cells/uL (ref 200–950)
Basophils Absolute: 28 cells/uL (ref 0–200)
Basophils Relative: 0.5 %
Eosinophils Absolute: 157 cells/uL (ref 15–500)
Eosinophils Relative: 2.8 %
HCT: 42.3 % (ref 35.0–45.0)
Hemoglobin: 14.3 g/dL (ref 11.7–15.5)
Lymphs Abs: 2078 cells/uL (ref 850–3900)
MCH: 30.7 pg (ref 27.0–33.0)
MCHC: 33.8 g/dL (ref 32.0–36.0)
MCV: 90.8 fL (ref 80.0–100.0)
MPV: 10.6 fL (ref 7.5–12.5)
Monocytes Relative: 9.4 %
Neutro Abs: 2811 cells/uL (ref 1500–7800)
Neutrophils Relative %: 50.2 %
Platelets: 306 10*3/uL (ref 140–400)
RBC: 4.66 10*6/uL (ref 3.80–5.10)
RDW: 12.1 % (ref 11.0–15.0)
Total Lymphocyte: 37.1 %
WBC: 5.6 10*3/uL (ref 3.8–10.8)

## 2021-08-02 LAB — LIPID PANEL
Cholesterol: 229 mg/dL — ABNORMAL HIGH (ref <200)
HDL: 61 mg/dL (ref >=50)
LDL Cholesterol (Calc): 117 mg/dL (calc) — ABNORMAL HIGH
Non-HDL Cholesterol (Calc): 168 mg/dL (calc) — ABNORMAL HIGH (ref <130)
Total CHOL/HDL Ratio: 3.8 (calc) (ref <5.0)
Triglycerides: 356 mg/dL — ABNORMAL HIGH (ref <150)

## 2021-08-02 LAB — VITAMIN D 25 HYDROXY (VIT D DEFICIENCY, FRACTURES): Vit D, 25-Hydroxy: 52 ng/mL (ref 30–100)

## 2021-10-01 ENCOUNTER — Telehealth: Payer: Self-pay | Admitting: Adult Health

## 2021-10-01 NOTE — Telephone Encounter (Signed)
When originally starting Cymbalta she said that Dr.McKeown suggested taking 1 20 mg tablet 2x a day to help combat her anxiety. She said she initially tried to just do 1 tablet but it is not working for her and her anxiety is very overwhelming. She is wanting a call back to let her know if she can increase the amount she is taking so she can start with what medication she has left over.  ?

## 2021-10-02 ENCOUNTER — Other Ambulatory Visit: Payer: Self-pay | Admitting: Adult Health

## 2021-10-02 MED ORDER — DULOXETINE HCL 20 MG PO CPEP: ORAL_CAPSULE | ORAL | 3 refills | Status: DC

## 2021-10-25 DIAGNOSIS — H5203 Hypermetropia, bilateral: Secondary | ICD-10-CM | POA: Diagnosis not present

## 2021-10-25 DIAGNOSIS — H52223 Regular astigmatism, bilateral: Secondary | ICD-10-CM | POA: Diagnosis not present

## 2021-10-25 DIAGNOSIS — H40213 Acute angle-closure glaucoma, bilateral: Secondary | ICD-10-CM | POA: Diagnosis not present

## 2021-10-25 DIAGNOSIS — H524 Presbyopia: Secondary | ICD-10-CM | POA: Diagnosis not present

## 2021-11-01 ENCOUNTER — Other Ambulatory Visit: Payer: Self-pay | Admitting: Internal Medicine

## 2021-11-01 DIAGNOSIS — E890 Postprocedural hypothyroidism: Secondary | ICD-10-CM | POA: Diagnosis not present

## 2021-11-01 DIAGNOSIS — R7301 Impaired fasting glucose: Secondary | ICD-10-CM | POA: Diagnosis not present

## 2021-11-01 DIAGNOSIS — C73 Malignant neoplasm of thyroid gland: Secondary | ICD-10-CM | POA: Diagnosis not present

## 2021-11-01 MED ORDER — DULOXETINE HCL 20 MG PO CPEP: ORAL_CAPSULE | ORAL | 3 refills | Status: DC

## 2021-11-08 DIAGNOSIS — N951 Menopausal and female climacteric states: Secondary | ICD-10-CM | POA: Diagnosis not present

## 2021-11-08 DIAGNOSIS — C73 Malignant neoplasm of thyroid gland: Secondary | ICD-10-CM | POA: Diagnosis not present

## 2021-11-08 DIAGNOSIS — E890 Postprocedural hypothyroidism: Secondary | ICD-10-CM | POA: Diagnosis not present

## 2021-11-08 DIAGNOSIS — R7301 Impaired fasting glucose: Secondary | ICD-10-CM | POA: Diagnosis not present

## 2021-11-14 ENCOUNTER — Other Ambulatory Visit: Payer: Self-pay | Admitting: Endocrinology

## 2021-11-14 DIAGNOSIS — Z1231 Encounter for screening mammogram for malignant neoplasm of breast: Secondary | ICD-10-CM

## 2021-11-14 DIAGNOSIS — N951 Menopausal and female climacteric states: Secondary | ICD-10-CM

## 2021-11-29 ENCOUNTER — Other Ambulatory Visit: Payer: Self-pay | Admitting: Endocrinology

## 2021-11-29 DIAGNOSIS — N951 Menopausal and female climacteric states: Secondary | ICD-10-CM

## 2021-11-29 DIAGNOSIS — E2839 Other primary ovarian failure: Secondary | ICD-10-CM

## 2022-01-30 ENCOUNTER — Encounter: Payer: PPO | Admitting: Adult Health

## 2022-01-30 NOTE — Progress Notes (Unsigned)
CPE  Assessment:   Encounter for Annual Physical Exam with abnormal findings Due annually  Health Maintenance reviewed Healthy lifestyle reviewed and goals set Has Mammogram and Dexa scheduled for 04/2022  Elevated blood pressure, situational Lifestyle controlled Monitor blood pressure at home; call if consistently over 130/80 Continue DASH diet.   Reminder to go to the ER if any CP, SOB, nausea, dizziness, severe HA, changes vision/speech, left arm numbness and tingling and jaw pain.  Mixed hyperlipidemia Lifestyle controlled Discussed dietary and exercise modifications Low fat diet Check lipids annually   Hypothyroidism, unspecified type/History of Malignant neoplasm of thyroid cartilage Taking levothyroxine 50 mcg 6 days/week , skips Sunday for many years Reminder to take on an empty stomach 30-30mns before first meal of the day. No antacid medications for 4 hours. Dr. BChalmers Cateralso follows   CKD 3A Increase fluids, avoid NSAIDS, monitor sugars, will monitor  -CMP  Depression, major, in remission (HCC)/ anxiety  Strong family hx; Continue Cymbalta 20 mg - not working as well as Lexapro did, not interested in change at this time but will consider in the future Lifestyle discussed: diet/exerise, sleep hygiene, stress management, hydration  Other abnormal glucose (prediabetes) Discussed disease and risks Discussed diet/exercise, weight management  Check A1C q685m  Vitamin D deficiency disease Continue supplementation  -Vit D  Pulmonary nodules Seen incidentally in 2016; shared decision making and declined follow up imaging; denies concerning sx today.   B12 deficiency On supplement   Medication management Review at each visit; check CBC, CMP/GFR, Magnesium  BMI 20 Continue to recommend diet heavy in fruits and veggies and low in animal meats, cheeses, and dairy products, appropriate calorie intake Discuss exercise recommendations routinely Continue to monitor  weight at each visit  Impacted Cerumen Right ear Attempted to remove with curette but could not visualize TM- did not want ear lavage Will try drops and bulb syringe at home If still unable to get cerumen removed will refer to ENT  Screening for hematuria/proteinuria -Routine UA with reflex microscopi - Microalbumin/creatinine ratio  Screening for ischemic heart disease - EKG   Orders Placed This Encounter  Procedures   CBC with Differential/Platelet   COMPLETE METABOLIC PANEL WITH GFR   Magnesium   Lipid panel   TSH   Hemoglobin A1c   VITAMIN D 25 Hydroxy (Vit-D Deficiency, Fractures)   Vitamin B12   Urinalysis, Routine w reflex microscopic   Microalbumin / creatinine urine ratio   EKG 12-Lead     Future Appointments  Date Time Provider [place]20[place]  9:30 AM [facility] MM 3 [facility]  05/16/2022 10:00 AM [facility] DX DEXA 1 [facility]  08/01/2022  3:00 PM WiAlycia RossettiNP [facility] None  02/03/2023  9:00 AM WiAlycia RossettiNP [facility] None     Subjective:   JaDeandre Stansels a [age] who presents for CPE. She has Anxiety; Depression, major, in remission ([place] Hypothyroidism; Medication management; Pulmonary nodules; Elevated blood pressure, situational; Mixed hyperlipidemia; Other abnormal glucose (prediabetes); Vitamin D deficiency disease; and Diverticulosis on their problem list.  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   No concerns today.   She has anxiety/depression predating 2016, has been doing well on cymbalta but not as good as on Lexapro.   BMI is Body mass index is 20.65 kg/m., she is working on diet and exercise. Almost  no meat. She does line dancing for exercise and [facility] Eats a lot of fruits and vegetables. She does eat some beans and eggs/cheese for protein Wt Readings  from Last 3 Encounters:  01/31/22 133 lb 12.8 oz (60.7 kg)  08/01/21 134 lb (60.8 kg)  06/12/21 134 lb 12.8 oz (61.1 kg)   She has had elevated blood pressure in the past but not treatment, lifestyle controlled. Her blood pressure has been controlled at home, today their BP is BP: (!) 100/58  BP Readings from Last 3 Encounters:  01/31/22 (!) 100/58  08/01/21 122/64  06/12/21 122/76    She does workout. She denies chest pain, shortness of breath, dizziness.   She has history of pulmonary lung nodules incidentally seen on cardiac index CT in 2016 that was recommended 1 year follow up; however she is very low risk for lung cancer, with shared decision making she opted not to pursue.   She is not on cholesterol medication and denies myalgias. Her cholesterol is at goal of LDL <130. CT coronary calcium score of 0 in 2016. She does eat more fiber, not taking fish oilThe cholesterol last visit was:  Lab Results  Component Value Date   CHOL 229 (H) 08/01/2021   HDL 61 08/01/2021   LDLCALC 117 (H) 08/01/2021   TRIG 356 (H) 08/01/2021   CHOLHDL 3.8 08/01/2021     She has been working on diet and exercise for prediabetes, and denies paresthesia of the feet, polydipsia and polyuria.  Last A1C in the office was:  Lab Results  Component Value Date   HGBA1C 5.6 01/30/2021   Drinking a lot of water.  Lab Results  Component Value Date   EGFR 59 (L) 08/01/2021    Patient is on Vitamin D supplement, she is on 2000 IU. Lab Results  Component Value Date   VD25OH 52 08/01/2021   She is on thyroid medication, 50 mcg tablet 6 x a week, none on Sunday. Her medication was not changed last visit, she is not on a biotin.  She follows with Dr. Chalmers Cater  Lab Results  Component Value Date   TSH 1.39 01/30/2021    She does take b complex daily.  Lab Results  Component Value Date   [id number] 349 01/30/2021     Medication Review Current Outpatient Medications on File Prior to Visit  Medication  Sig Dispense Refill   Ascorbic Acid (VITAMIN C) 1000 MG tablet Take 1,000 mg by mouth daily.     B Complex Vitamins (B COMPLEX PO) Take by mouth.     CHOLECALCIFEROL PO Take 1 tablet by mouth daily. 2000 IU daily     DULoxetine (CYMBALTA) 20 MG capsule Take 1 capsule  2 x /day  for Mood /Anxiety. 180 capsule 3   levothyroxine (SYNTHROID) 50 MCG tablet TAKE 1 TABLET BY MOUTH EVERY DAY BEFORE BREAKFAST 90 tablet 3   No current facility-administered medications on file prior to visit.    Current Problems (verified) Patient Active Problem List   Diagnosis Date Noted   Diverticulosis 01/30/2021   Elevated blood pressure, situational 03/20/2015   Mixed hyperlipidemia 03/20/2015   Other abnormal glucose (prediabetes) 03/20/2015   Vitamin D deficiency disease 03/20/2015   Pulmonary nodules 08/23/2014   Medication management 05/17/2014   Anxiety    Depression, major, in remission ([place])    Hypothyroidism     Screening Tests Immunization History  Administered Date(s) Administered   PFIZER Comirnaty(Gray Top)Covid-19 Tri-Sucrose  Vaccine 09/25/2020   PFIZER(Purple Top)SARS-COV-2 Vaccination 07/24/2019, 08/18/2019, 04/29/2020, 09/25/2020   Td 06/30/2017   Tdap 06/17/2006   Preventative care: Last colonoscopy: 2014, Dr. Henrene Pastor, diverticulosis, due 46 TKPTW(6568) EGD 2014 normal  Last mammogram: 01/19/2020, scheduled for 04/2022 Last pap smear/pelvic exam: 2011 normal, never had abnormal, declines LMP age 49  DEXA: 06/2014 normal, "It was excellent" scheduled for 04/2022  Prior vaccinations: TD or Tdap: 2019 Influenza: 2008, declines Pneumococcal: declines Prevnar13:  declines Shingles/Zostavax: declines COVID 19: last 09/25/2020  Names of Other Physician/Practitioners you currently use: 1. [facility]- here for primary care 2. Dr. Trenton Gammon, dentist, last 2022, goes q80m3. Dr. MSabra Heck ophth, last 2021, will schedule 4. Dr. LUbaldo Glassing derm, last 02/2020,  will continue with Dr. Amy JMartinique has scheduled next week.   Patient Care Team: MUnk Pinto MD as PCP - General (Internal Medicine) NRozetta Nunnery MD (Inactive) as Consulting Physician (Otolaryngology) GMelissa Noon OSan Fidelas Referring Physician (Optometry) LRolm Bookbinder MD as Consulting Physician (Dermatology) PIrene Shipper MD as Consulting Physician (Gastroenterology) BJacelyn Pi MD as Consulting Physician (Endocrinology)   Allergies No Known Allergies  SURGICAL HISTORY She  has a past surgical history that includes Thyroid cyst excision (1993); Tonsillectomy and adenoidectomy; and Colonoscopy. FAMILY HISTORY Her family history includes Bladder Cancer in her mother; Depression in her father and sister; Heart attack in her father; Heart disease (age of onset: 571 in her father; Mental illness in her sister; Suicidality in her sister; Thyroid cancer in her father. SOCIAL HISTORY She  reports that she quit smoking about 53 years ago. Her smoking use included cigarettes. She has never used smokeless tobacco. She reports that she does not currently use alcohol. She reports that she does not use drugs.  Review of Systems  Constitutional:  Negative for malaise/fatigue and weight loss.  HENT:  Negative for hearing loss and tinnitus.   Eyes:  Negative for blurred vision and double vision.  Respiratory:  Negative for cough, sputum production, shortness of breath and wheezing.   Cardiovascular:  Negative for chest pain, palpitations, orthopnea, claudication, leg swelling and PND.  Gastrointestinal:  Negative for abdominal pain, blood in stool, constipation, diarrhea, heartburn, melena, nausea and vomiting.  Genitourinary: Negative.   Musculoskeletal:  Negative for falls, joint pain and myalgias.  Skin:  Negative for rash.  Neurological:  Negative for dizziness, tingling, sensory change, weakness and headaches.  Endo/Heme/Allergies:  Negative for polydipsia.   Psychiatric/Behavioral: Negative.  Negative for depression, memory loss, substance abuse and suicidal ideas. The patient is not nervous/anxious and does not have insomnia.   All other systems reviewed and are negative.   Objective:   Blood pressure (!) 100/58, pulse 67, temperature (!) 97.5 F (36.4 C), height 5' 7.5" (1.715 m), weight 133 lb 12.8 oz (60.7 kg), SpO2 96 %. Body mass index is 20.65 kg/m.  General appearance: alert, no distress, WD/WN,  female HEENT: normocephalic, sclerae anicteric, L TMs pearly, R TM impacted with cerumen unable to visualize- could not remove with curette. Nares patent, no discharge or erythema, pharynx normal Oral cavity: MMM, no lesions Neck: supple, no lymphadenopathy, no thyromegaly, no masses Heart: RRR, normal S1, S2, no murmurs Lungs: CTA bilaterally, no wheezes, rhonchi, or rales Abdomen: +bs, soft, non tender, non distended, no masses, no hepatomegaly, no splenomegaly Musculoskeletal: nontender, no swelling, no obvious deformity Extremities: no edema, no cyanosis, no clubbing Pulses: 2+ symmetric, upper and lower extremities, normal cap refill Neurological: alert, oriented x 3, CN2-12 intact,  strength normal upper extremities and lower extremities, sensation normal throughout, DTRs 2+ throughout, no cerebellar signs, gait normal Psychiatric: normal affect, behavior normal, pleasant  Breast: defer, getting mammogram, no concerns Gyn: defer Rectal: defer Skin: warm dry intact; no concerning lesions, rash, ecchymoses.   EKG: NSR, IRBBB, NSCPT   Alycia Rossetti, NP   01/31/2022

## 2022-01-31 ENCOUNTER — Ambulatory Visit (INDEPENDENT_AMBULATORY_CARE_PROVIDER_SITE_OTHER): Payer: PPO | Admitting: Nurse Practitioner

## 2022-01-31 ENCOUNTER — Encounter: Payer: Self-pay | Admitting: Nurse Practitioner

## 2022-01-31 VITALS — BP 100/58 | HR 67 | Temp 97.5°F | Ht 67.5 in | Wt 133.8 lb

## 2022-01-31 DIAGNOSIS — I10 Essential (primary) hypertension: Secondary | ICD-10-CM | POA: Diagnosis not present

## 2022-01-31 DIAGNOSIS — Z0000 Encounter for general adult medical examination without abnormal findings: Secondary | ICD-10-CM

## 2022-01-31 DIAGNOSIS — F325 Major depressive disorder, single episode, in full remission: Secondary | ICD-10-CM

## 2022-01-31 DIAGNOSIS — E782 Mixed hyperlipidemia: Secondary | ICD-10-CM

## 2022-01-31 DIAGNOSIS — Z79899 Other long term (current) drug therapy: Secondary | ICD-10-CM | POA: Diagnosis not present

## 2022-01-31 DIAGNOSIS — R030 Elevated blood-pressure reading, without diagnosis of hypertension: Secondary | ICD-10-CM

## 2022-01-31 DIAGNOSIS — Z1389 Encounter for screening for other disorder: Secondary | ICD-10-CM | POA: Diagnosis not present

## 2022-01-31 DIAGNOSIS — E559 Vitamin D deficiency, unspecified: Secondary | ICD-10-CM

## 2022-01-31 DIAGNOSIS — Z136 Encounter for screening for cardiovascular disorders: Secondary | ICD-10-CM

## 2022-01-31 DIAGNOSIS — Z6820 Body mass index (BMI) 20.0-20.9, adult: Secondary | ICD-10-CM

## 2022-01-31 DIAGNOSIS — E538 Deficiency of other specified B group vitamins: Secondary | ICD-10-CM

## 2022-01-31 DIAGNOSIS — C323 Malignant neoplasm of laryngeal cartilage: Secondary | ICD-10-CM

## 2022-01-31 DIAGNOSIS — N1831 Chronic kidney disease, stage 3a: Secondary | ICD-10-CM | POA: Diagnosis not present

## 2022-01-31 DIAGNOSIS — H6121 Impacted cerumen, right ear: Secondary | ICD-10-CM

## 2022-01-31 DIAGNOSIS — Z6821 Body mass index (BMI) 21.0-21.9, adult: Secondary | ICD-10-CM

## 2022-01-31 DIAGNOSIS — E039 Hypothyroidism, unspecified: Secondary | ICD-10-CM | POA: Diagnosis not present

## 2022-01-31 DIAGNOSIS — Z85850 Personal history of malignant neoplasm of thyroid: Secondary | ICD-10-CM

## 2022-01-31 DIAGNOSIS — R7309 Other abnormal glucose: Secondary | ICD-10-CM | POA: Diagnosis not present

## 2022-01-31 DIAGNOSIS — R918 Other nonspecific abnormal finding of lung field: Secondary | ICD-10-CM

## 2022-02-01 ENCOUNTER — Other Ambulatory Visit: Payer: Self-pay | Admitting: Nurse Practitioner

## 2022-02-01 ENCOUNTER — Telehealth: Payer: Self-pay | Admitting: Nurse Practitioner

## 2022-02-01 ENCOUNTER — Other Ambulatory Visit: Payer: Self-pay | Admitting: Adult Health Nurse Practitioner

## 2022-02-01 DIAGNOSIS — F325 Major depressive disorder, single episode, in full remission: Secondary | ICD-10-CM

## 2022-02-01 LAB — MAGNESIUM: Magnesium: 2.4 mg/dL (ref 1.5–2.5)

## 2022-02-01 LAB — TSH: TSH: 1.21 mIU/L (ref 0.40–4.50)

## 2022-02-01 LAB — CBC WITH DIFFERENTIAL/PLATELET
Absolute Monocytes: 390 cells/uL (ref 200–950)
Basophils Absolute: 42 cells/uL (ref 0–200)
Basophils Relative: 0.8 %
Eosinophils Absolute: 218 cells/uL (ref 15–500)
Eosinophils Relative: 4.2 %
HCT: 42.5 % (ref 35.0–45.0)
Hemoglobin: 14.7 g/dL (ref 11.7–15.5)
Lymphs Abs: 1451 cells/uL (ref 850–3900)
MCH: 30.8 pg (ref 27.0–33.0)
MCHC: 34.6 g/dL (ref 32.0–36.0)
MCV: 89.1 fL (ref 80.0–100.0)
MPV: 10.9 fL (ref 7.5–12.5)
Monocytes Relative: 7.5 %
Neutro Abs: 3099 cells/uL (ref 1500–7800)
Neutrophils Relative %: 59.6 %
Platelets: 307 10*3/uL (ref 140–400)
RBC: 4.77 10*6/uL (ref 3.80–5.10)
RDW: 12.4 % (ref 11.0–15.0)
Total Lymphocyte: 27.9 %
WBC: 5.2 10*3/uL (ref 3.8–10.8)

## 2022-02-01 LAB — LIPID PANEL
Cholesterol: 262 mg/dL — ABNORMAL HIGH (ref <200)
HDL: 79 mg/dL (ref >=50)
LDL Cholesterol (Calc): 159 mg/dL (calc) — ABNORMAL HIGH
Non-HDL Cholesterol (Calc): 183 mg/dL (calc) — ABNORMAL HIGH (ref <130)
Total CHOL/HDL Ratio: 3.3 (calc) (ref <5.0)
Triglycerides: 119 mg/dL (ref <150)

## 2022-02-01 LAB — COMPLETE METABOLIC PANEL WITH GFR
AG Ratio: 2 (calc) (ref 1.0–2.5)
ALT: 18 U/L (ref 6–29)
AST: 21 U/L (ref 10–35)
Albumin: 4.7 g/dL (ref 3.6–5.1)
Alkaline phosphatase (APISO): 60 U/L (ref 37–153)
BUN: 18 mg/dL (ref 7–25)
CO2: 29 mmol/L (ref 20–32)
Calcium: 9.5 mg/dL (ref 8.6–10.4)
Chloride: 105 mmol/L (ref 98–110)
Creat: 0.84 mg/dL (ref 0.60–1.00)
Globulin: 2.3 g/dL (calc) (ref 1.9–3.7)
Glucose, Bld: 96 mg/dL (ref 65–99)
Potassium: 5 mmol/L (ref 3.5–5.3)
Sodium: 139 mmol/L (ref 135–146)
Total Bilirubin: 0.7 mg/dL (ref 0.2–1.2)
Total Protein: 7 g/dL (ref 6.1–8.1)
eGFR: 72 mL/min/{1.73_m2} (ref >=60)

## 2022-02-01 LAB — URINALYSIS, ROUTINE W REFLEX MICROSCOPIC
Bilirubin Urine: NEGATIVE
Glucose, UA: NEGATIVE
Hgb urine dipstick: NEGATIVE
Ketones, ur: NEGATIVE
Leukocytes,Ua: NEGATIVE
Nitrite: NEGATIVE
Protein, ur: NEGATIVE
Specific Gravity, Urine: 1.011 (ref 1.001–1.035)
pH: <=5 (ref 5.0–8.0)

## 2022-02-01 LAB — HEMOGLOBIN A1C
Hgb A1c MFr Bld: 5.7 % of total Hgb — ABNORMAL HIGH (ref <5.7)
Mean Plasma Glucose: 117 mg/dL
eAG (mmol/L): 6.5 mmol/L

## 2022-02-01 LAB — MICROALBUMIN / CREATININE URINE RATIO
Creatinine, Urine: 60 mg/dL (ref 20–275)
Microalb, Ur: <0.2 mg/dL

## 2022-02-01 LAB — VITAMIN D 25 HYDROXY (VIT D DEFICIENCY, FRACTURES): Vit D, 25-Hydroxy: 47 ng/mL (ref 30–100)

## 2022-02-01 LAB — VITAMIN B12: Vitamin B-12: 791 pg/mL (ref 200–1100)

## 2022-02-01 MED ORDER — DULOXETINE HCL 20 MG PO CPEP: ORAL_CAPSULE | ORAL | 3 refills | Status: DC

## 2022-02-01 NOTE — Telephone Encounter (Signed)
Patient is requesting a refill on generic cymbalta to [facility] on [address].

## 2022-02-04 ENCOUNTER — Telehealth: Payer: Self-pay | Admitting: Nurse Practitioner

## 2022-02-04 NOTE — Telephone Encounter (Signed)
Labs mailed out.

## 2022-02-04 NOTE — Telephone Encounter (Signed)
Patient is requesting a copy of her most recent labs be mailed to her.

## 2022-02-14 NOTE — Progress Notes (Signed)
Assessment and Plan:  Debra Hayes was seen today for acute visit.  Diagnoses and all orders for this visit:  Hypothyroidism, unspecified type Please take your thyroid medication greater than 30 min before breakfast, separated by at least 4 hours  from antacids, calcium, iron, and multivitamins.  Chronic anxiety Plan to decrease duloxetine to 1 tab daily x 5 and then 1 tab every other day x 5 doses.  Start Celexa 10 mg 1 tab daily Follow up in 3 weeks to evaluate medication or sooner if symptoms worsen -     citalopram (CELEXA) 10 MG tablet; Take 1 tablet (10 mg total) by mouth daily.       Further disposition pending results of labs. Discussed med's effects and SE's.   Over 30 minutes of exam, counseling, chart review, and critical decision making was performed.   Future Appointments  Date Time Provider [place]  05/16/2022  9:30 AM [facility] MM 3 [facility]  05/16/2022 10:00 AM [facility] DX DEXA 1 [facility]  08/01/2022  3:00 PM Alycia Rossetti, NP [facility] None  02/03/2023  9:00 AM Alycia Rossetti, NP [facility] None    ------------------------------------------------------------------------------------------------------------------   HPI BP 114/62   Pulse 87   Temp 97.7 F (36.5 C)   Ht 5' 7.5" (1.715 m)   Wt 133 lb 3.2 oz (60.4 kg)   SpO2 98%   BMI 20.55 kg/m   75 y.o.female presents for discussion of psych meds.  She is currently on Cymbalta 20 mg daily and does not believe it is working as well as Lexapro did in the past. She is having a lot of anxiety . She is still trying to go to her dance class that used to bring her so much joy but is no longer giving her pleasure- makes herself go. She will not do activities unless she "makes" herself. Has ruminating thoughts of worry. She is also having panic attacks- feels throat constriction and bad taste. These issues were previously controlled with Lexapro until it stopped working 9 months ago. She  does see a therapist and uses relaxation techniques, breathing.    BMI is Body mass index is 20.55 kg/m., she has been working on diet and exercise. Wt Readings from Last 3 Encounters:  02/19/22 133 lb 3.2 oz (60.4 kg)  01/31/22 133 lb 12.8 oz (60.7 kg)  08/01/21 134 lb (60.8 kg)   BP's are currently well controlled without medication. Denies headaches, chest pain, shortness of breath and dizziness BP Readings from Last 3 Encounters:  02/19/22 114/62  01/31/22 (!) 100/58  08/01/21 122/64     She is currently on levothyroxine 50 mcg daily x 6 days and skips Sundays.  Last TSH was: Lab Results  Component Value Date   TSH 1.21 01/31/2022     Past Medical History:  Diagnosis Date   Anemia    Anxiety    Cancer (HCC)    Depression    Hypothyroidism    Prediabetes    RLS (restless legs syndrome)      No Known Allergies  Current Outpatient Medications on File Prior to Visit  Medication Sig   Ascorbic Acid (VITAMIN C) 1000 MG tablet Take 1,000 mg by mouth daily.   B Complex Vitamins (B COMPLEX PO) Take by mouth.   CHOLECALCIFEROL PO Take 1 tablet by mouth daily. 2000 IU daily   DULoxetine (CYMBALTA) 20 MG capsule Take 1 capsule  2 x /day  for Mood /Anxiety.   levothyroxine (SYNTHROID) 50  MCG tablet TAKE 1 TABLET BY MOUTH EVERY DAY BEFORE BREAKFAST   No current facility-administered medications on file prior to visit.    ROS: all negative except above.   Physical Exam:  BP 114/62   Pulse 87   Temp 97.7 F (36.5 C)   Ht 5' 7.5" (1.715 m)   Wt 133 lb 3.2 oz (60.4 kg)   SpO2 98%   BMI 20.55 kg/m   General Appearance: Thin anxious female in no apparent distress. Eyes: PERRLA, EOMs, conjunctiva no swelling or erythema Sinuses: No Frontal/maxillary tenderness ENT/Mouth: Ext aud canals clear, TMs without erythema, bulging. No erythema, swelling, or exudate on post pharynx.  Tonsils not swollen or erythematous. Hearing normal.  Neck: Supple, thyroid normal.   Respiratory: Respiratory effort normal, BS equal bilaterally without rales, rhonchi, wheezing or stridor.  Cardio: RRR with no MRGs. Brisk peripheral pulses without edema.  Abdomen: Soft, + BS.  Non tender, no guarding, rebound, hernias, masses. Lymphatics: Non tender without lymphadenopathy.  Musculoskeletal: Full ROM, 5/5 strength, normal gait.  Skin: Warm, dry without rashes, lesions, ecchymosis.  Neuro: Cranial nerves intact. Normal muscle tone, no cerebellar symptoms. Sensation intact.  Psych: Awake and oriented X 3, anxious affect, Insight and Judgment appropriate.     Alycia Rossetti, NP 11:58 AM [facility]

## 2022-02-19 ENCOUNTER — Encounter: Payer: Self-pay | Admitting: Nurse Practitioner

## 2022-02-19 ENCOUNTER — Ambulatory Visit (INDEPENDENT_AMBULATORY_CARE_PROVIDER_SITE_OTHER): Payer: PPO | Admitting: Nurse Practitioner

## 2022-02-19 VITALS — BP 114/62 | HR 87 | Temp 97.7°F | Ht 67.5 in | Wt 133.2 lb

## 2022-02-19 DIAGNOSIS — F419 Anxiety disorder, unspecified: Secondary | ICD-10-CM

## 2022-02-19 DIAGNOSIS — E039 Hypothyroidism, unspecified: Secondary | ICD-10-CM

## 2022-02-19 MED ORDER — CITALOPRAM HYDROBROMIDE 10 MG PO TABS: 10.0000 mg | ORAL_TABLET | Freq: Every day | ORAL | 2 refills | Status: DC

## 2022-02-19 NOTE — Patient Instructions (Addendum)
Start to taper duloxetine to one a day for 5 days then every other day for 5 days and then stop the medication Begin Celexa 10 mg tomorrow and take during the taper.   Citalopram Tablets What is this medication? CITALOPRAM (sye TAL oh pram) treats depression. It increases the amount of serotonin in the brain, a hormone that helps regulate mood. It belongs to a group of medications called SSRIs. This medicine may be used for other purposes; ask your health care provider or pharmacist if you have questions. COMMON BRAND NAME(S): Celexa What should I tell my care team before I take this medication? They need to know if you have any of these conditions: Bipolar disorder or a family history of bipolar disorder Bleeding disorders Glaucoma Heart disease History of irregular heartbeat Kidney disease Liver disease Low levels of magnesium or potassium in the blood Receiving electroconvulsive therapy Seizures Suicidal thoughts, plans, or attempt; a previous suicide attempt by you or a family member Take medications that treat or prevent blood clots Thyroid disease An unusual or allergic reaction to citalopram, escitalopram, other medications, foods, dyes, or preservatives Pregnant or trying to become pregnant Breast-feeding How should I use this medication? Take this medication by mouth with a glass of water. Follow the directions on the prescription label. You can take it with or without food. Take your medication at regular intervals. Do not take your medication more often than directed. Do not stop taking this medication suddenly except upon the advice of your care team. Stopping this medication too quickly may cause serious side effects or your condition may worsen. A special MedGuide will be given to you by the pharmacist with each prescription and refill. Be sure to read this information carefully each time. Talk to your care team about the use of this medication in children. Special care may  be needed. Patients over 29 years old may have a stronger reaction and need a smaller dose. Overdosage: If you think you have taken too much of this medicine contact a poison control center or emergency room at once. NOTE: This medicine is only for you. Do not share this medicine with others. What if I miss a dose? If you miss a dose, take it as soon as you can. If it is almost time for your next dose, take only that dose. Do not take double or extra doses. What may interact with this medication? Do not take this medication with any of the following: Certain medications for fungal infections like fluconazole, itraconazole, ketoconazole, posaconazole, voriconazole Cisapride Dronedarone Escitalopram Linezolid MAOIs like Carbex, Eldepryl, Marplan, Nardil, and Parnate Methylene blue (injected into a vein) Pimozide Thioridazine This medication may also interact with the following: Alcohol Amphetamines Aspirin and aspirin-like medications Carbamazepine Certain medications for depression, anxiety, or psychotic disturbances Certain medications for infections like chloroquine, clarithromycin, erythromycin, furazolidone, isoniazid, pentamidine Certain medications for migraine headaches like almotriptan, eletriptan, frovatriptan, naratriptan, rizatriptan, sumatriptan, zolmitriptan Certain medications for sleep Certain medications that treat or prevent blood clots like dalteparin, enoxaparin, warfarin Cimetidine Diuretics Dofetilide Fentanyl Lithium Methadone Metoprolol NSAIDs, medications for pain and inflammation, like ibuprofen or naproxen Omeprazole Other medications that prolong the QT interval (cause an abnormal heart rhythm) Procarbazine Rasagiline Supplements like St. John's wort, kava kava, valerian Tramadol Tryptophan Ziprasidone This list may not describe all possible interactions. Give your health care provider a list of all the medicines, herbs, non-prescription drugs, or  dietary supplements you use. Also tell them if you smoke, drink alcohol, or use illegal  drugs. Some items may interact with your medicine. What should I watch for while using this medication? Tell your care team if your symptoms do not get better or if they get worse. Visit your care team for regular checks on your progress. Because it may take several weeks to see the full effects of this medication, it is important to continue your treatment as prescribed. Watch for new or worsening thoughts of suicide or depression. This includes sudden changes in mood, behavior, or thoughts. These changes can happen at any time but are more common in the beginning of treatment or after a change in dose. Call your care team right away if you experience these thoughts or worsening depression. Manic episodes may happen in patients with bipolar disorder who take this medication. Watch for changes in feelings or behaviors such as feeling anxious, nervous, agitated, panicky, irritable, hostile, aggressive, impulsive, severely restless, overly excited and hyperactive, or trouble sleeping. These symptoms can happen at anytime but are more common in the beginning of treatment or after a change in dose. Call you care team right away if you notice any of these symptoms. You may get drowsy or dizzy. Do not drive, use machinery, or do anything that needs mental alertness until you know how this medication affects you. Do not stand or sit up quickly, especially if you are an older patient. This reduces the risk of dizzy or fainting spells. Alcohol may interfere with the effect of this medication. Avoid alcoholic drinks. Your mouth may get dry. Chewing sugarless gum or sucking hard candy, and drinking plenty of water may help. Contact your care team if the problem does not go away or is severe. What side effects may I notice from receiving this medication? Side effects that you should report to your care team as soon as  possible: Allergic reactions--skin rash, itching, hives, swelling of the face, lips, tongue, or throat Bleeding--bloody or black, tar-like stools, red or dark brown urine, vomiting blood or brown material that looks like coffee grounds, small, red or purple spots on skin, unusual bleeding or bruising Heart rhythm changes--fast or irregular heartbeat, dizziness, feeling faint or lightheaded, chest pain, trouble breathing Low sodium level--muscle weakness, fatigue, dizziness, headache, confusion Serotonin syndrome--irritability, confusion, fast or irregular heartbeat, muscle stiffness, twitching muscles, sweating, high fever, seizure, chills, vomiting, diarrhea Sudden eye pain or change in vision such as blurry vision, seeing halos around lights, vision loss Thoughts of suicide or self-harm, worsening mood, feelings of depression Side effects that usually do not require medical attention (report to your care team if they continue or are bothersome): Change in sex drive or performance Diarrhea Dry mouth Excessive sweating Nausea Tremors or shaking Upset stomach This list may not describe all possible side effects. Call your doctor for medical advice about side effects. You may report side effects to FDA at 1-800-FDA-1088. Where should I keep my medication? Keep out of reach of children and pets. Store at room temperature between 15 and 30 degrees C (59 and 86 degrees F). Throw away any unused medication after the expiration date. NOTE: This sheet is a summary. It may not cover all possible information. If you have questions about this medicine, talk to your doctor, pharmacist, or health care provider.  2023 Elsevier/Gold Standard (2007-07-25 00:00:00)

## 2022-02-25 ENCOUNTER — Other Ambulatory Visit: Payer: Self-pay | Admitting: Nurse Practitioner

## 2022-02-25 ENCOUNTER — Telehealth: Payer: Self-pay | Admitting: Nurse Practitioner

## 2022-02-25 DIAGNOSIS — F419 Anxiety disorder, unspecified: Secondary | ICD-10-CM

## 2022-02-25 MED ORDER — CITALOPRAM HYDROBROMIDE 20 MG PO TABS: 20.0000 mg | ORAL_TABLET | Freq: Every day | ORAL | 2 refills | Status: DC

## 2022-02-25 NOTE — Telephone Encounter (Signed)
I have sent in a script for 20 mg  She can double up the 10 mg ones she has if she would like to finish the prescription she currently has.  Please advise that I would expect we would need a higher dose than 10.

## 2022-02-25 NOTE — Telephone Encounter (Signed)
Patient states that she has been on Celexa '10mg'$  for a week now with no change in anxiety. She is wanting to know if she can increase to '20mg'$ ? If so, will you call in a rx to reflect the dosing change to [facility] on  [address]? Patient would like a call from [facility] either way.

## 2022-02-25 NOTE — Telephone Encounter (Signed)
Patient aware and will call if needed before her next appointment.

## 2022-03-11 NOTE — Progress Notes (Deleted)
Assessment and Plan:  There are no diagnoses linked to this encounter.    Further disposition pending results of labs. Discussed med's effects and SE's.   Over 30 minutes of exam, counseling, chart review, and critical decision making was performed.   Future Appointments  Date Time Provider [place]  03/12/2022 11:30 AM Alycia Rossetti, NP [facility] None  05/16/2022  9:30 AM [facility] MM 3 [facility]  05/16/2022 10:00 AM [facility] DX DEXA 1 [facility]  08/01/2022  3:00 PM Alycia Rossetti, NP [facility] None  02/03/2023  9:00 AM Alycia Rossetti, NP [facility] None    ------------------------------------------------------------------------------------------------------------------   HPI There were no vitals taken for this visit. 76 y.o.female presents for  Past Medical History:  Diagnosis Date   Anemia    Anxiety    Cancer (HCC)    Depression    Hypothyroidism    Prediabetes    RLS (restless legs syndrome)      No Known Allergies  Current Outpatient Medications on File Prior to Visit  Medication Sig   Ascorbic Acid (VITAMIN C) 1000 MG tablet Take 1,000 mg by mouth daily.   B Complex Vitamins (B COMPLEX PO) Take by mouth.   CHOLECALCIFEROL PO Take 1 tablet by mouth daily. 2000 IU daily   citalopram (CELEXA) 20 MG tablet Take 1 tablet (20 mg total) by mouth daily.   levothyroxine (SYNTHROID) 50 MCG tablet TAKE 1 TABLET BY MOUTH EVERY DAY BEFORE BREAKFAST   [DISCONTINUED] DULoxetine (CYMBALTA) 20 MG capsule Take 1 capsule  2 x /day  for Mood /Anxiety.   No current facility-administered medications on file prior to visit.    ROS: all negative except above.   Physical Exam:  There were no vitals taken for this visit.  General Appearance: Well nourished, in no apparent distress. Eyes: PERRLA, EOMs, conjunctiva no swelling or erythema Sinuses: No Frontal/maxillary tenderness ENT/Mouth: Ext aud canals clear, TMs without erythema, bulging.  No erythema, swelling, or exudate on post pharynx.  Tonsils not swollen or erythematous. Hearing normal.  Neck: Supple, thyroid normal.  Respiratory: Respiratory effort normal, BS equal bilaterally without rales, rhonchi, wheezing or stridor.  Cardio: RRR with no MRGs. Brisk peripheral pulses without edema.  Abdomen: Soft, + BS.  Non tender, no guarding, rebound, hernias, masses. Lymphatics: Non tender without lymphadenopathy.  Musculoskeletal: Full ROM, 5/5 strength, normal gait.  Skin: Warm, dry without rashes, lesions, ecchymosis.  Neuro: Cranial nerves intact. Normal muscle tone, no cerebellar symptoms. Sensation intact.  Psych: Awake and oriented X 3, normal affect, Insight and Judgment appropriate.     Alycia Rossetti, NP 12:24 PM [facility]

## 2022-03-12 ENCOUNTER — Ambulatory Visit: Payer: PPO | Admitting: Nurse Practitioner

## 2022-03-12 NOTE — Progress Notes (Unsigned)
    Future Appointments  Date Time Provider Department  03/13/2022 10:30 AM Unk Pinto, MD [facility]  08/01/2022                             wellness  3:00 PM Alycia Rossetti, NP [facility]  02/03/2023                             cpe  9:00 AM Alycia Rossetti, NP [facility]    History of Present Illness:      Patient is a very nice 76 yo [name] with labile HTN, HLD, PreDM, Hypothyroidism & Dysthymia who returns for 3 week  f/u after transitioning off  Duloxetine to Celexa.    Patient  did increase her Celexa 10 mg up to 20 mg  & relates that she felt better, but now feels like she is worse. She has stopped the Celexa & desires to retry  Lexapro.    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   Observations/Objective:  BP 112/60   Pulse 78   Temp 97.7 F (36.5 C)   Resp 16   Ht 5' 7.5" (1.715 m)   Wt 130 lb 6.4 oz (59.1 kg)   SpO2 97%   BMI 20.12 kg/m   No formal exam. Approx 20 minutes discussing her hx/o anxieties   Assessment and Plan:   1. Chronic anxiety  - escitalopram (LEXAPRO) 10 MG tablet;  Take 1 tablet Daily for Mood   Dispense: 90 tablet; Refill: 3  - buPROPion (WELLBUTRIN XL) 150 MG  Take 1 tablet every Morning for Mood, Focus & Concentration   Dispense: 90 tablet; Refill: 3   Follow Up Instructions:             I discussed the assessment and treatment plan with the patient. The patient was provided an  opportunity to ask questions and all were answered. The patient agreed with the plan and demonstrated an understanding of the instructions.       The patient was advised to call back or seek an in-person evaluation if the symptoms worsen or if the condition fails to improve as anticipated.  Recommended f/u in 3 weeks.    Kirtland Bouchard, MD

## 2022-03-13 ENCOUNTER — Encounter: Payer: Self-pay | Admitting: Internal Medicine

## 2022-03-13 ENCOUNTER — Ambulatory Visit (INDEPENDENT_AMBULATORY_CARE_PROVIDER_SITE_OTHER): Payer: PPO | Admitting: Internal Medicine

## 2022-03-13 VITALS — BP 112/60 | HR 78 | Temp 97.7°F | Resp 16 | Ht 67.5 in | Wt 130.4 lb

## 2022-03-13 DIAGNOSIS — F419 Anxiety disorder, unspecified: Secondary | ICD-10-CM | POA: Diagnosis not present

## 2022-03-13 MED ORDER — BUPROPION HCL ER (XL) 150 MG PO TB24: ORAL_TABLET | ORAL | 3 refills | Status: AC

## 2022-03-13 MED ORDER — ESCITALOPRAM OXALATE 10 MG PO TABS: ORAL_TABLET | ORAL | 3 refills | Status: AC

## 2022-03-13 NOTE — Patient Instructions (Signed)

## 2022-03-21 DIAGNOSIS — F411 Generalized anxiety disorder: Secondary | ICD-10-CM | POA: Diagnosis not present

## 2022-04-02 ENCOUNTER — Ambulatory Visit: Payer: PPO | Admitting: Internal Medicine

## 2022-04-04 IMAGING — MG DIGITAL SCREENING BILAT W/ TOMO W/ CAD
6 of 10 series · 6 of 30 positions shown · non-contrast
Comparison: Previous exam(s).

CLINICAL DATA: Screening.

EXAM:
DIGITAL SCREENING BILATERAL MAMMOGRAM WITH TOMO AND CAD

[R CC synth-2D (1 of 2)]
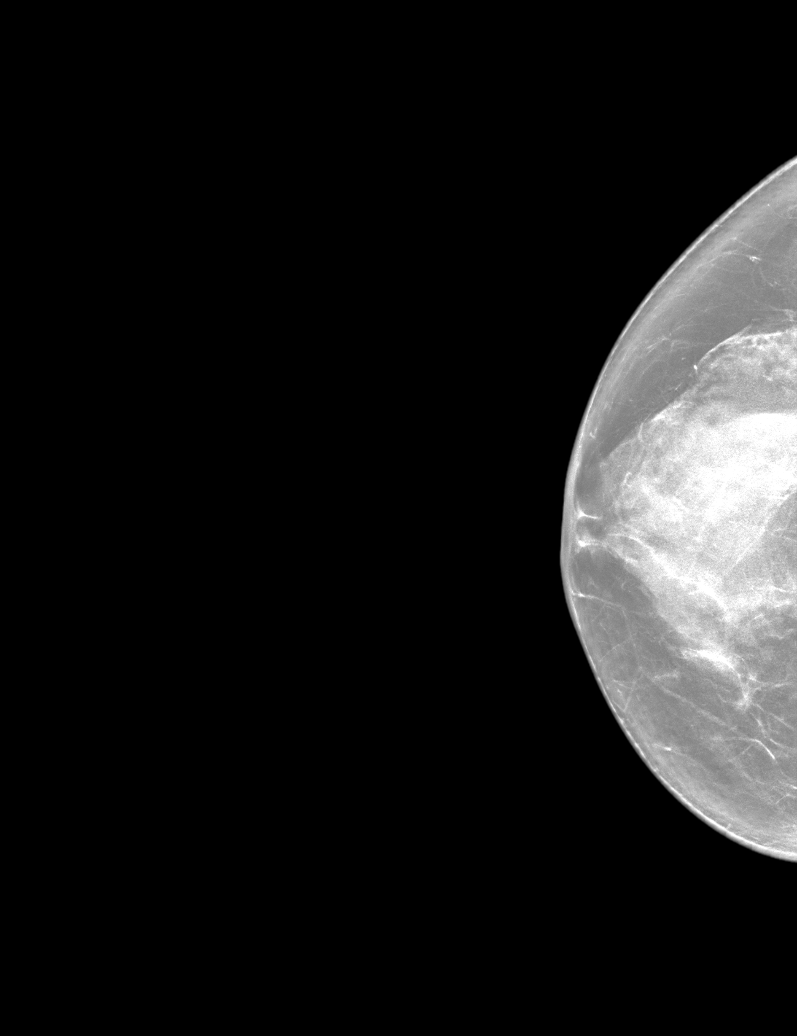

[L CC synth-2D]
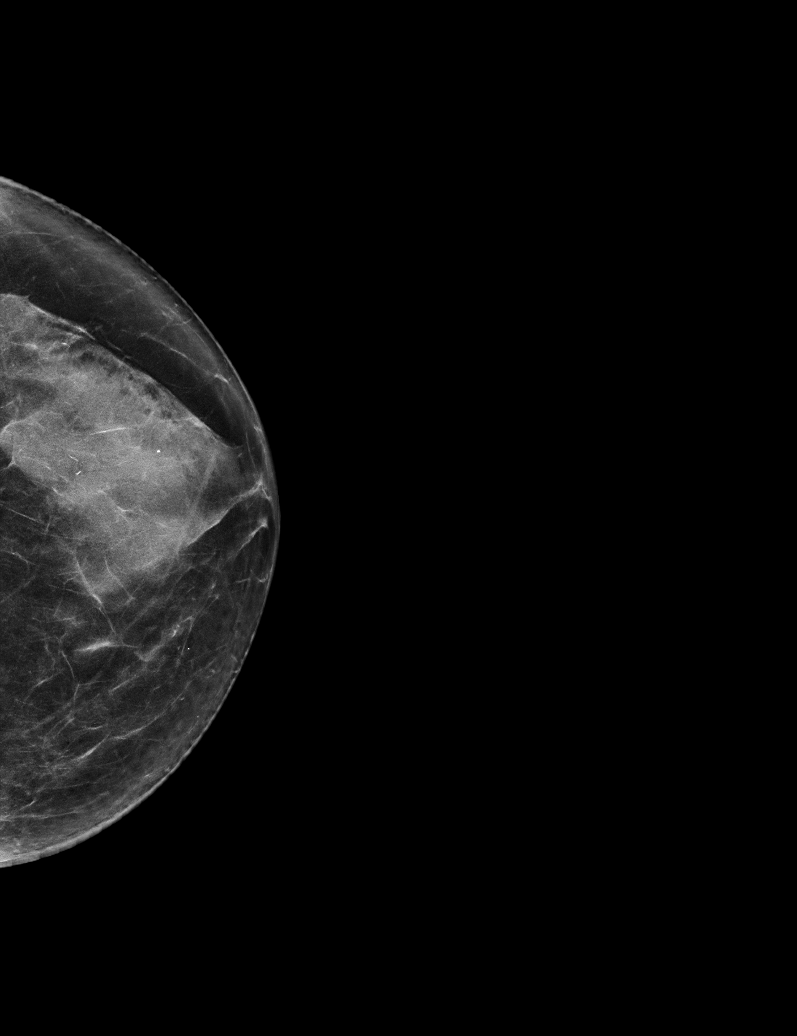

[R CC synth-2D (2 of 2)]
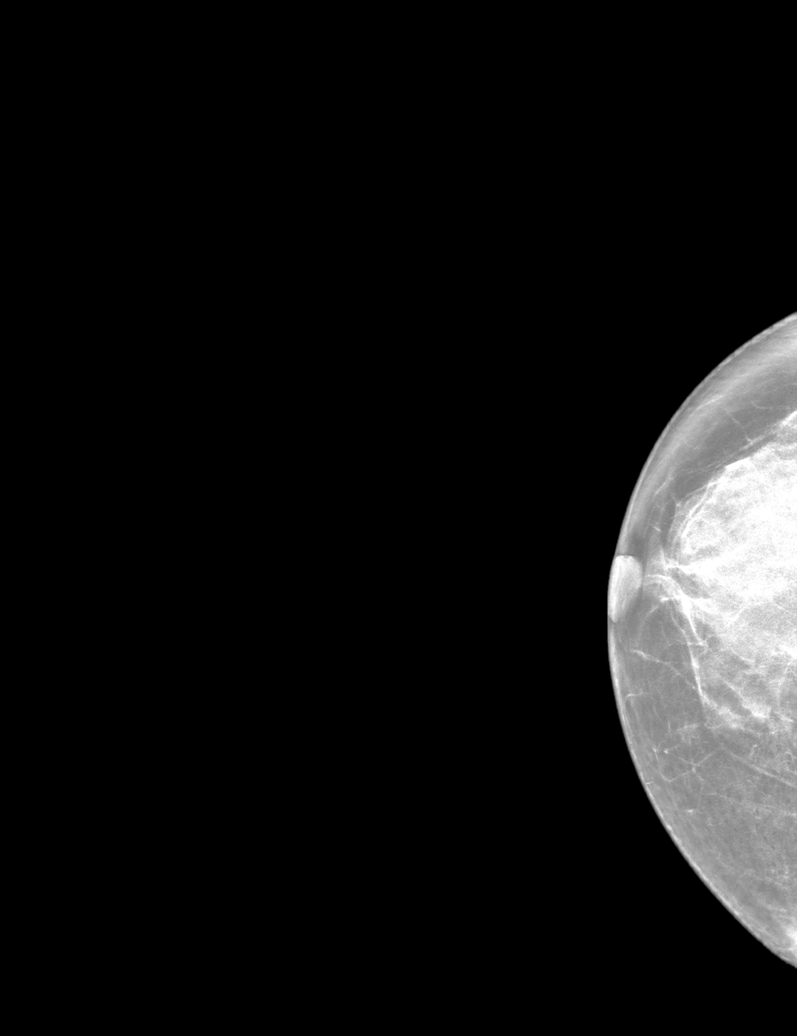

[R MLO synth-2D]
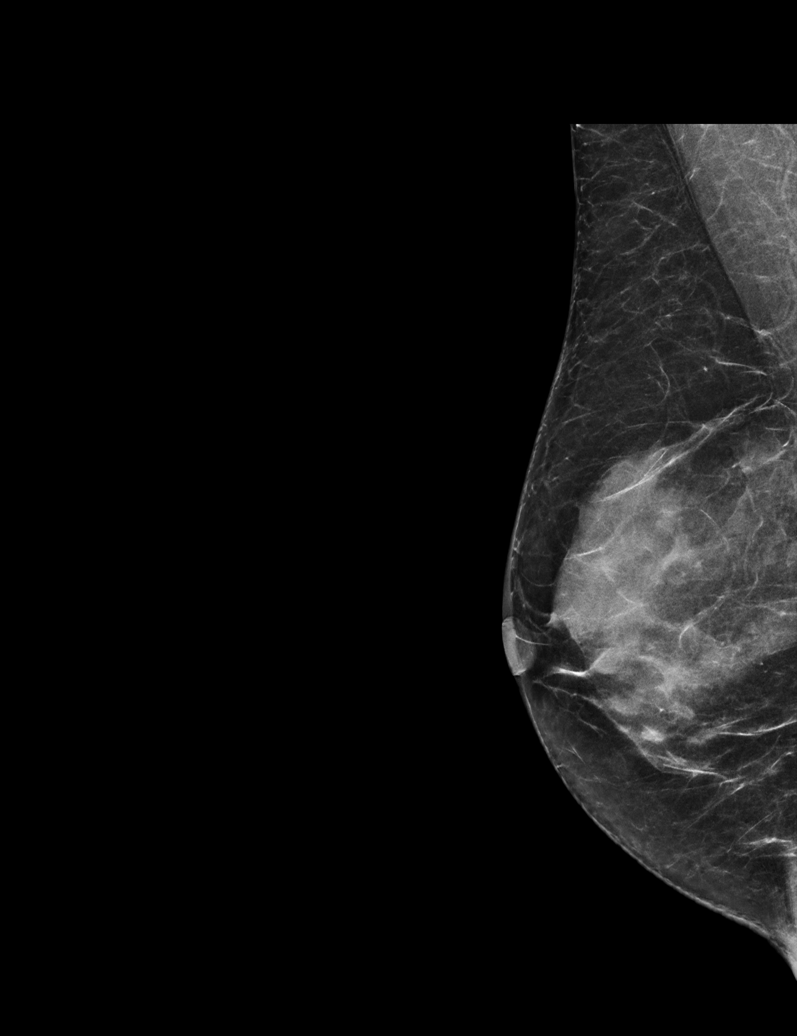

[L MLO synth-2D]
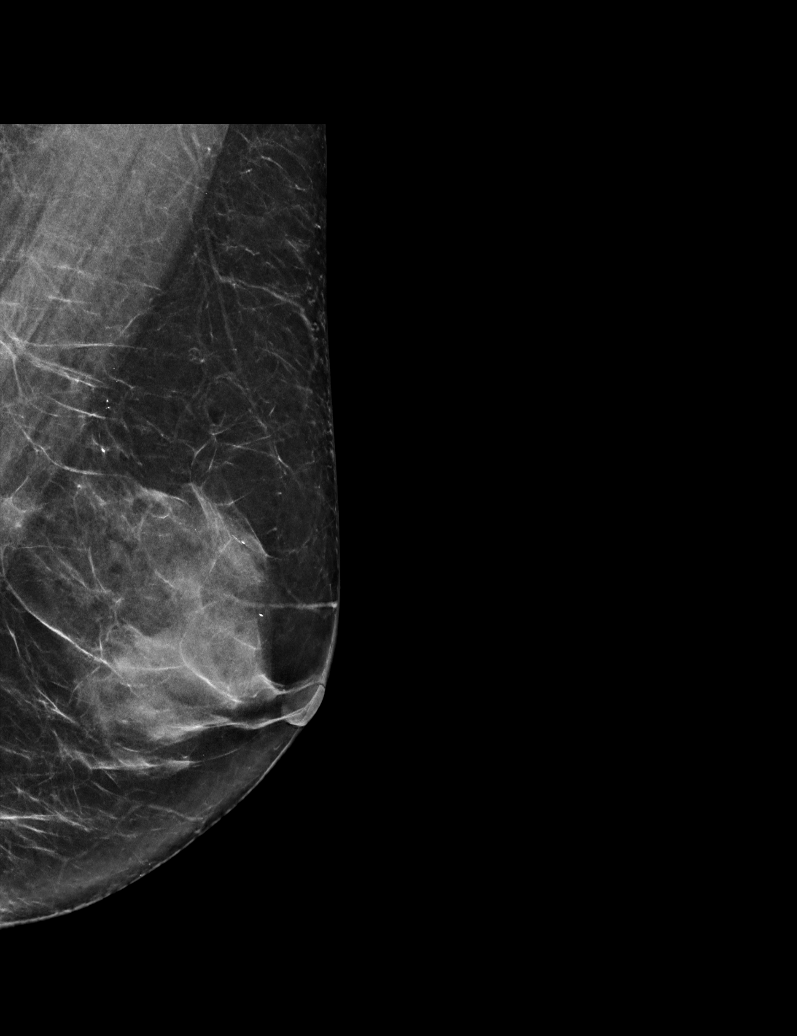

[L CC tomo · tomo slice 32/63.0]
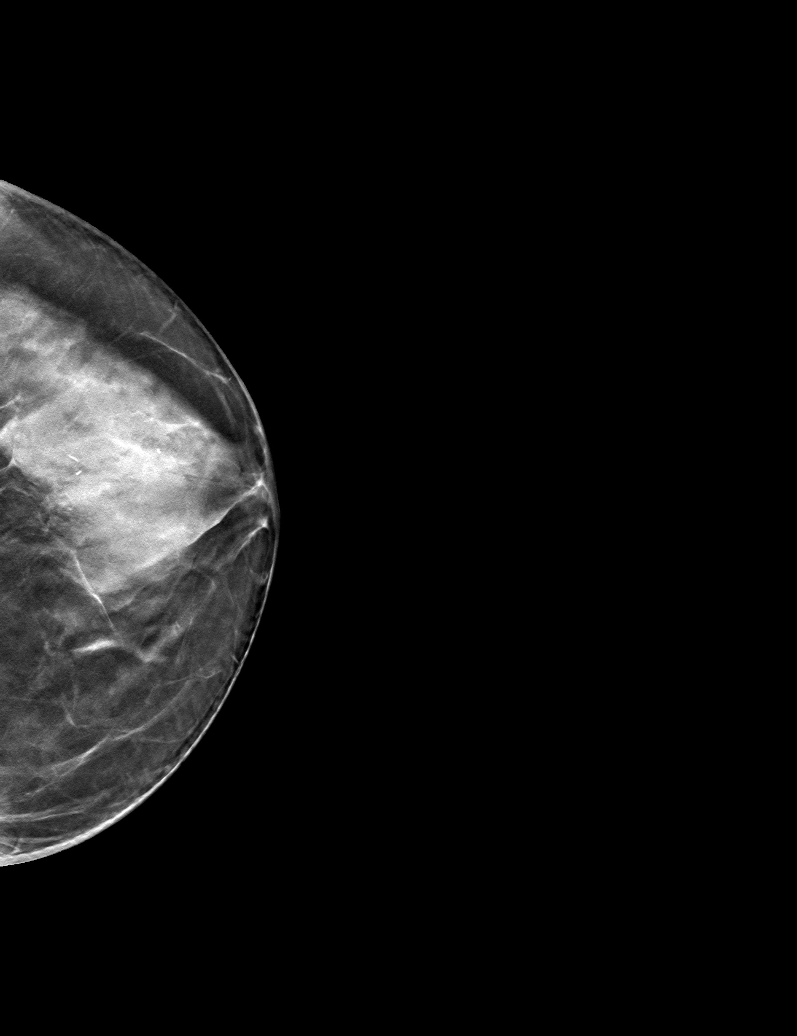

[6 of 30 positions shown; findings below may reference images not displayed]

ACR Breast Density Category d: The breast tissue is extremely dense,
which lowers the sensitivity of mammography
FINDINGS: There are no findings suspicious for malignancy. Images were
processed with CAD.
IMPRESSION: No mammographic evidence of malignancy. A result letter of this
screening mammogram will be mailed directly to the patient.

RECOMMENDATION:
Screening mammogram in one year. (Code:WO-[id number])

BI-RADS CATEGORY  1: Negative.

## 2022-04-09 DIAGNOSIS — F411 Generalized anxiety disorder: Secondary | ICD-10-CM | POA: Diagnosis not present

## 2022-04-11 DIAGNOSIS — F411 Generalized anxiety disorder: Secondary | ICD-10-CM | POA: Diagnosis not present

## 2022-04-18 DIAGNOSIS — F411 Generalized anxiety disorder: Secondary | ICD-10-CM | POA: Diagnosis not present

## 2022-04-18 DIAGNOSIS — E039 Hypothyroidism, unspecified: Secondary | ICD-10-CM | POA: Diagnosis not present

## 2022-04-18 DIAGNOSIS — F325 Major depressive disorder, single episode, in full remission: Secondary | ICD-10-CM | POA: Diagnosis not present

## 2022-04-25 DIAGNOSIS — F411 Generalized anxiety disorder: Secondary | ICD-10-CM | POA: Diagnosis not present

## 2022-04-29 DIAGNOSIS — F411 Generalized anxiety disorder: Secondary | ICD-10-CM | POA: Diagnosis not present

## 2022-05-07 DIAGNOSIS — F411 Generalized anxiety disorder: Secondary | ICD-10-CM | POA: Diagnosis not present

## 2022-05-16 ENCOUNTER — Ambulatory Visit
Admission: RE | Admit: 2022-05-16 | Discharge: 2022-05-16 | Disposition: A | Discharge status: Home / Self Care | Payer: PPO | Source: Ambulatory Visit | Attending: Endocrinology | Admitting: Endocrinology

## 2022-05-16 DIAGNOSIS — M8589 Other specified disorders of bone density and structure, multiple sites: Secondary | ICD-10-CM | POA: Diagnosis not present

## 2022-05-16 DIAGNOSIS — E2839 Other primary ovarian failure: Secondary | ICD-10-CM

## 2022-05-16 DIAGNOSIS — Z780 Asymptomatic menopausal state: Secondary | ICD-10-CM | POA: Diagnosis not present

## 2022-05-16 DIAGNOSIS — Z1231 Encounter for screening mammogram for malignant neoplasm of breast: Secondary | ICD-10-CM

## 2022-05-16 DIAGNOSIS — F411 Generalized anxiety disorder: Secondary | ICD-10-CM | POA: Diagnosis not present

## 2022-05-16 DIAGNOSIS — N951 Menopausal and female climacteric states: Secondary | ICD-10-CM

## 2022-05-23 DIAGNOSIS — F411 Generalized anxiety disorder: Secondary | ICD-10-CM | POA: Diagnosis not present

## 2022-05-27 DIAGNOSIS — E890 Postprocedural hypothyroidism: Secondary | ICD-10-CM | POA: Diagnosis not present

## 2022-05-27 DIAGNOSIS — C73 Malignant neoplasm of thyroid gland: Secondary | ICD-10-CM | POA: Diagnosis not present

## 2022-05-27 DIAGNOSIS — R7301 Impaired fasting glucose: Secondary | ICD-10-CM | POA: Diagnosis not present

## 2022-05-27 DIAGNOSIS — M8580 Other specified disorders of bone density and structure, unspecified site: Secondary | ICD-10-CM | POA: Diagnosis not present

## 2022-05-27 DIAGNOSIS — N951 Menopausal and female climacteric states: Secondary | ICD-10-CM | POA: Diagnosis not present

## 2022-05-30 DIAGNOSIS — F411 Generalized anxiety disorder: Secondary | ICD-10-CM | POA: Diagnosis not present

## 2022-06-06 DIAGNOSIS — F411 Generalized anxiety disorder: Secondary | ICD-10-CM | POA: Diagnosis not present

## 2022-06-07 ENCOUNTER — Other Ambulatory Visit (HOSPITAL_BASED_OUTPATIENT_CLINIC_OR_DEPARTMENT_OTHER): Payer: Self-pay

## 2022-06-13 DIAGNOSIS — M8589 Other specified disorders of bone density and structure, multiple sites: Secondary | ICD-10-CM | POA: Diagnosis not present

## 2022-06-13 DIAGNOSIS — F411 Generalized anxiety disorder: Secondary | ICD-10-CM | POA: Diagnosis not present

## 2022-08-01 ENCOUNTER — Ambulatory Visit: Payer: PPO | Admitting: Nurse Practitioner

## 2022-09-04 ENCOUNTER — Other Ambulatory Visit (HOSPITAL_BASED_OUTPATIENT_CLINIC_OR_DEPARTMENT_OTHER): Payer: Self-pay

## 2022-09-04 MED ORDER — COMIRNATY 30 MCG/0.3ML IM SUSY
PREFILLED_SYRINGE | INTRAMUSCULAR | 0 refills | Status: AC
  Filled 2022-09-04: qty 0.3, 1d supply, fill #0

## 2022-10-16 ENCOUNTER — Encounter: Payer: Self-pay | Admitting: Internal Medicine

## 2023-02-03 ENCOUNTER — Encounter: Payer: PPO | Admitting: Nurse Practitioner

## 2024-03-01 ENCOUNTER — Other Ambulatory Visit (HOSPITAL_BASED_OUTPATIENT_CLINIC_OR_DEPARTMENT_OTHER): Payer: Self-pay

## 2024-03-01 MED ORDER — COMIRNATY 30 MCG/0.3ML IM SUSY
0.3000 mL | PREFILLED_SYRINGE | Freq: Once | INTRAMUSCULAR | 0 refills | Status: AC
  Filled 2024-03-01: qty 0.3, 1d supply, fill #0
